# Patient Record
Sex: Female | Born: 1980 | Race: Black or African American | Hispanic: No | Marital: Single | Smoking: Current every day smoker
Health system: Southern US, Community
[De-identification: ages and names within clinical notes are randomized; demographics above are authoritative.]

## PROBLEM LIST (undated history)

## (undated) DIAGNOSIS — R519 Headache, unspecified: Secondary | ICD-10-CM

## (undated) DIAGNOSIS — K649 Unspecified hemorrhoids: Secondary | ICD-10-CM

## (undated) DIAGNOSIS — D649 Anemia, unspecified: Secondary | ICD-10-CM

## (undated) DIAGNOSIS — T148XXA Other injury of unspecified body region, initial encounter: Secondary | ICD-10-CM

## (undated) DIAGNOSIS — I10 Essential (primary) hypertension: Secondary | ICD-10-CM

## (undated) HISTORY — DX: Other injury of unspecified body region, initial encounter: T14.8XXA

---

## 2004-07-11 ENCOUNTER — Emergency Department (HOSPITAL_COMMUNITY): Admission: EM | Admit: 2004-07-11 | Discharge: 2004-07-12 | Payer: Self-pay | Admitting: Emergency Medicine

## 2005-05-27 ENCOUNTER — Emergency Department (HOSPITAL_COMMUNITY): Admission: EM | Admit: 2005-05-27 | Discharge: 2005-05-27 | Payer: Self-pay | Admitting: *Deleted

## 2005-08-25 ENCOUNTER — Emergency Department (HOSPITAL_COMMUNITY): Admission: EM | Admit: 2005-08-25 | Discharge: 2005-08-25 | Payer: Self-pay | Admitting: Emergency Medicine

## 2005-08-28 ENCOUNTER — Emergency Department (HOSPITAL_COMMUNITY): Admission: EM | Admit: 2005-08-28 | Discharge: 2005-08-28 | Payer: Self-pay | Admitting: Emergency Medicine

## 2006-03-21 ENCOUNTER — Emergency Department (HOSPITAL_COMMUNITY): Admission: EM | Admit: 2006-03-21 | Discharge: 2006-03-21 | Payer: Self-pay | Admitting: Emergency Medicine

## 2006-10-12 ENCOUNTER — Inpatient Hospital Stay (HOSPITAL_COMMUNITY): Admission: AD | Admit: 2006-10-12 | Discharge: 2006-10-12 | Payer: Self-pay | Admitting: Obstetrics and Gynecology

## 2006-11-12 ENCOUNTER — Inpatient Hospital Stay (HOSPITAL_COMMUNITY): Admission: RE | Admit: 2006-11-12 | Discharge: 2006-11-13 | Payer: Self-pay | Admitting: Obstetrics & Gynecology

## 2006-11-18 ENCOUNTER — Inpatient Hospital Stay (HOSPITAL_COMMUNITY): Admission: AD | Admit: 2006-11-18 | Discharge: 2006-11-21 | Payer: Self-pay | Admitting: Obstetrics & Gynecology

## 2006-12-12 ENCOUNTER — Emergency Department (HOSPITAL_COMMUNITY): Admission: EM | Admit: 2006-12-12 | Discharge: 2006-12-12 | Payer: Self-pay | Admitting: *Deleted

## 2010-12-06 NOTE — Discharge Summary (Signed)
NAME:  ROSELY, FERNANDEZ           ACCOUNT NO.:  [id number]   MEDICAL RECORD NO.:  [id number]          PATIENT TYPE:  EMS   LOCATION:  MINO                         FACILITY:  [facility]   PHYSICIAN:  Genia Del, M.D.DATE OF BIRTH:  1980/07/23   DATE OF ADMISSION:  12/12/2006  DATE OF DISCHARGE:  12/12/2006                               DISCHARGE SUMMARY   DISCHARGE SUMMARY:  The patient is a 30 year old G1 at 40 weeks and 5  days gestation. An attempt to induce was done for post dates with  Cervidil and Pitocin.  The patient's cervix was 1 cm, tight, long,  vertex -3, membranes were intact and she was contracting every 3-5  minutes mildly with Pitocin.  The fetal heart rate monitoring was  reactive, baseline of 140s, no decelerations. The decision was taken to  stop the induction process of giving the well-being of the baby and to  follow-up with an ultrasound for biophysical profile, estimated fetal  weight, and amniotic fluid index on April 28 and reevaluate the cervix  and decide at that time if induction is warranted.      Genia Del, M.D.  Electronically Signed     ML/MEDQ  D:  01/12/2007  T:  01/12/2007  Job:  [id number]

## 2011-01-10 ENCOUNTER — Emergency Department (HOSPITAL_BASED_OUTPATIENT_CLINIC_OR_DEPARTMENT_OTHER)
Admission: EM | Admit: 2011-01-10 | Discharge: 2011-01-10 | Disposition: A | Discharge status: Home / Self Care | Payer: Medicaid Other | Attending: Emergency Medicine | Admitting: Emergency Medicine

## 2011-01-10 DIAGNOSIS — F17200 Nicotine dependence, unspecified, uncomplicated: Secondary | ICD-10-CM | POA: Insufficient documentation

## 2011-01-10 DIAGNOSIS — R1013 Epigastric pain: Secondary | ICD-10-CM | POA: Insufficient documentation

## 2011-01-10 LAB — CBC
HCT: 35 % — ABNORMAL LOW (ref 36.0–46.0)
Hemoglobin: 12.1 g/dL (ref 12.0–15.0)
MCV: 87.3 fL (ref 78.0–100.0)
WBC: 4.8 10*3/uL (ref 4.0–10.5)

## 2011-01-10 LAB — COMPREHENSIVE METABOLIC PANEL
ALT: 15 U/L (ref 0–35)
Albumin: 3.5 g/dL (ref 3.5–5.2)
Alkaline Phosphatase: 59 U/L (ref 39–117)
BUN: 7 mg/dL (ref 6–23)
CO2: 26 mEq/L (ref 19–32)
Chloride: 103 mEq/L (ref 96–112)
GFR calc Af Amer: >60 mL/min (ref >60)
GFR calc non Af Amer: >60 mL/min (ref >60)

## 2011-01-10 LAB — URINALYSIS, ROUTINE W REFLEX MICROSCOPIC
Nitrite: NEGATIVE
Protein, ur: NEGATIVE mg/dL
pH: 6 (ref 5.0–8.0)

## 2011-01-10 LAB — DIFFERENTIAL
Basophils Absolute: 0 10*3/uL (ref 0.0–0.1)
Basophils Relative: 0 % (ref 0–1)
Lymphs Abs: 1.8 10*3/uL (ref 0.7–4.0)
Monocytes Absolute: 0.5 10*3/uL (ref 0.1–1.0)

## 2017-09-22 ENCOUNTER — Emergency Department (HOSPITAL_BASED_OUTPATIENT_CLINIC_OR_DEPARTMENT_OTHER)
Admission: EM | Admit: 2017-09-22 | Discharge: 2017-09-22 | Disposition: A | Discharge status: Home / Self Care | Payer: Self-pay | Attending: Emergency Medicine | Admitting: Emergency Medicine

## 2017-09-22 ENCOUNTER — Encounter (HOSPITAL_BASED_OUTPATIENT_CLINIC_OR_DEPARTMENT_OTHER): Payer: Self-pay

## 2017-09-22 ENCOUNTER — Other Ambulatory Visit: Payer: Self-pay

## 2017-09-22 DIAGNOSIS — F17210 Nicotine dependence, cigarettes, uncomplicated: Secondary | ICD-10-CM | POA: Insufficient documentation

## 2017-09-22 DIAGNOSIS — K645 Perianal venous thrombosis: Secondary | ICD-10-CM | POA: Insufficient documentation

## 2017-09-22 HISTORY — DX: Unspecified hemorrhoids: K64.9

## 2017-09-22 MED ORDER — OXYCODONE-ACETAMINOPHEN 5-325 MG PO TABS: 1.0000 | ORAL_TABLET | Freq: Once | ORAL | Status: DC

## 2017-09-22 MED ORDER — HYDROCORTISONE ACETATE 25 MG RE SUPP: 25.0000 mg | Freq: Two times a day (BID) | RECTAL | 0 refills | Status: DC

## 2017-09-22 NOTE — ED Notes (Signed)
Pt c/o increased rectal pain as of this afternoon, tried sitz bath without relief.  Pt has had diarrhea recently, unsure if she is having any bleeding.

## 2017-09-22 NOTE — ED Notes (Signed)
ED Provider at bedside. 

## 2017-09-22 NOTE — ED Notes (Signed)
Pt rang out asking for pain medication, nurse asked pt if her ride was here, pt yelled and cursed at nurse loud enough to be heard in other rooms with the door closed.  Pt yelled that she did not have a way to call her ride, despite using her phone and talking on it in front of nurse.  Attempted to explain to patient again that her ride needs to be at the bedside, instead, pt continued to yell and curse at nurse.

## 2017-09-22 NOTE — ED Notes (Signed)
EDP informed pt that she needed to have a ride at the bedside in order to get percocet.

## 2017-09-22 NOTE — ED Notes (Signed)
At this time, pt is discharged as she is irate, out of control and aggressive.  Went into patient's room with another staff member, and pt yelled "can't you see that I'm on phone?  Can't you see that I am busy and on the phone?"  Informed pt that she is discharged and tried to explain her prescriptions, pt continued to yell at staff.  Security and charge nurse called to room d/t pt's increased aggression.

## 2017-09-22 NOTE — ED Triage Notes (Signed)
Pt c/o hemorrhoids-hx of same with worse x today-pacing in triage

## 2017-09-22 NOTE — Discharge Instructions (Signed)
Please use steroid suppository to help with your hemorrhoid pain.  Continue warm sitz baths.  Schedule an appointment with [facility] for further evaluation and management of your ongoing hemorrhoid problems.

## 2017-09-22 NOTE — ED Notes (Signed)
Assisted EDPA with rectal exam.Patient tolerated well. Both rails put up on bed due to patient rolling all over the bed to keep patient from falling out. Stretcher locked in lowest position.

## 2017-09-22 NOTE — ED Notes (Signed)
Pt rang out bc she is in pain, informed that she is waiting for the provider

## 2017-09-22 NOTE — ED Provider Notes (Signed)
[facility] EMERGENCY DEPARTMENT Provider Note   CSN: [id number] Arrival date & time: 09/22/17  1935     History   Chief Complaint Chief Complaint  Patient presents with  . Hemorrhoids    HPI Vickie Ford is a 37 y.o. female.  HPI  Vickie Ford is a 37yo female history of hemorrhoids who presents to the emergency department for evaluation of hemorrhoid pain.  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  Her current flareup of pain began 3 days ago.  She was treating herself with over-the-counter steroid cream and sitz bath with some improvement.  Today she states that her pain has been constant and 10/10 in severity, described as "knives stabbing my butt hole." Pain worsened with sitting or having a bowel movement. Reports having soft bowel movement today, denies constipation. Denies blood in stool.  Was given information in the past to see a general surgeon, although has never followed up.  Denies fever, chills, nausea/vomiting, abdominal pain, dysuria, urinary frequency.  Past Medical History:  Diagnosis Date  . Hemorrhoid     There are no active problems to display for this patient.   Past Surgical History:  Procedure Laterality Date  . CESAREAN SECTION      OB History    No data available       Home Medications    Prior to Admission medications   Not on File    Family History No family history on file.  Social History Social History   Tobacco Use  . Smoking status: Current Every Day Smoker  . Smokeless tobacco: Never Used  Substance Use Topics  . Alcohol use: Yes    Comment: occ  . Drug use: No     Allergies   Penicillins   Review of Systems Review of Systems  Constitutional: Negative for chills and fever.  Gastrointestinal: Positive for rectal pain (hemorrhoid). Negative for abdominal pain, anal bleeding, blood in stool, nausea and  vomiting.  Musculoskeletal: Negative for gait problem.  Skin: Negative for color change and rash.     Physical Exam Updated Vital Signs BP (!) 150/71 (BP Location: Right Arm)   Pulse 94   Temp 97.9 F (36.6 C) (Oral)   Resp 14   Ht 5\' 8"  (1.727 m)   Wt (!) 162.4 kg (358 lb)   LMP 08/28/2017   SpO2 100%   BMI 54.43 kg/m   Physical Exam  Constitutional: She is oriented to person, place, and time. She appears well-developed and well-nourished. No distress.  Patient appears extremely anxious and painful, pacing.   HENT:  Head: Normocephalic and atraumatic.  Eyes: Right eye exhibits no discharge. Left eye exhibits no discharge.  Pulmonary/Chest: Effort normal. No respiratory distress.  Abdominal: Soft. Bowel sounds are normal. There is no tenderness. There is no guarding.  Genitourinary:  Genitourinary Comments: Chaperone present for exam. Several skin tags. One .5cm palpable thrombosed external hemorrhoid. No gross blood. Normal rectal tone, no tenderness, no mass or fissure.  Neurological: She is alert and oriented to person, place, and time. Coordination normal.  Skin: Skin is warm and dry. She is not diaphoretic.  Psychiatric: She has a normal mood and affect. Her behavior is normal.  Nursing note and vitals reviewed.    ED Treatments / Results  Labs (all labs ordered are listed, but only abnormal results are displayed) Labs Reviewed - No data to display  EKG  EKG Interpretation None       Radiology No results found.  Procedures Procedures (including critical care time)  Medications Ordered in ED Medications  oxyCODONE-acetaminophen (PERCOCET/ROXICET) 5-325 MG per tablet 1 tablet (1 tablet Oral Not Given 09/22/17 2206)     Initial Impression / Assessment and Plan / ED Course  I have reviewed the triage vital signs and the nursing notes.  Pertinent labs & imaging results that were available during my care of the patient were reviewed by me and considered in  my medical decision making (see chart for details).     Patient with one small thrombosed hemorrhoid. Does not appear appropriate for I&D. Have counseled patient on symptomatic management including sitz baths and will prescribe steroid suppository for pain.  No constipation or abdominal tenderness. Have given patient information to follow-up with general surgery for permanent management given this has been an ongoing problem for her over the past 5878yrs. Discussed return precautions and patient agrees and voices understanding to the above plan.   Final Clinical Impressions(s) / ED Diagnoses   Final diagnoses:  External thrombosed hemorrhoids    ED Discharge Orders        Ordered    hydrocortisone (ANUSOL-HC) 25 MG suppository  2 times daily     09/22/17 2137       Kellie ShropshireShrosbree, Zeniya Lapidus J, PA-C 09/23/17 0024    Arby BarrettePfeiffer, Marcy, MD 09/26/17 [phone]

## 2017-09-29 ENCOUNTER — Encounter (HOSPITAL_COMMUNITY): Payer: Self-pay

## 2017-09-29 ENCOUNTER — Emergency Department (HOSPITAL_COMMUNITY)
Admission: EM | Admit: 2017-09-29 | Discharge: 2017-09-29 | Discharge status: Against Medical Advice | Payer: Self-pay | Attending: Emergency Medicine | Admitting: Emergency Medicine

## 2017-09-29 DIAGNOSIS — Z5321 Procedure and treatment not carried out due to patient leaving prior to being seen by health care provider: Secondary | ICD-10-CM | POA: Insufficient documentation

## 2017-09-29 NOTE — ED Notes (Signed)
Called PT to be seen no response

## 2017-09-29 NOTE — ED Triage Notes (Signed)
Pt presents with c/o hemorrhoids. Pt was recently seen for same and reports that they are not getting any better.

## 2017-09-29 NOTE — ED Notes (Signed)
Called twice for vitals and no answer.

## 2017-10-30 ENCOUNTER — Other Ambulatory Visit: Payer: Self-pay

## 2017-10-30 ENCOUNTER — Encounter (HOSPITAL_COMMUNITY): Payer: Self-pay | Admitting: Emergency Medicine

## 2017-10-30 ENCOUNTER — Emergency Department (HOSPITAL_COMMUNITY)
Admission: EM | Admit: 2017-10-30 | Discharge: 2017-10-31 | Disposition: A | Discharge status: Home / Self Care | Payer: Self-pay | Attending: Emergency Medicine | Admitting: Emergency Medicine

## 2017-10-30 DIAGNOSIS — Z5321 Procedure and treatment not carried out due to patient leaving prior to being seen by health care provider: Secondary | ICD-10-CM | POA: Insufficient documentation

## 2017-10-30 DIAGNOSIS — K649 Unspecified hemorrhoids: Secondary | ICD-10-CM | POA: Insufficient documentation

## 2017-10-30 NOTE — ED Triage Notes (Signed)
C/o hemorrhoids x 2 months.  States she has been seen in ED x 4 for same.

## 2017-10-30 NOTE — ED Notes (Signed)
No answer for triage.

## 2017-10-31 NOTE — ED Notes (Signed)
No answer in waiting area.

## 2017-10-31 NOTE — ED Notes (Addendum)
No answer in waiting area.  Previous nurse first/tech first did not see patient leave.

## 2017-10-31 NOTE — ED Notes (Signed)
No answer for treatment room. 

## 2018-03-07 ENCOUNTER — Encounter (HOSPITAL_COMMUNITY): Payer: Self-pay

## 2018-03-07 ENCOUNTER — Other Ambulatory Visit: Payer: Self-pay

## 2018-03-07 ENCOUNTER — Emergency Department (HOSPITAL_COMMUNITY)
Admission: EM | Admit: 2018-03-07 | Discharge: 2018-03-07 | Disposition: A | Discharge status: Home / Self Care | Payer: Self-pay | Attending: Emergency Medicine | Admitting: Emergency Medicine

## 2018-03-07 DIAGNOSIS — K649 Unspecified hemorrhoids: Secondary | ICD-10-CM | POA: Insufficient documentation

## 2018-03-07 DIAGNOSIS — F17200 Nicotine dependence, unspecified, uncomplicated: Secondary | ICD-10-CM | POA: Insufficient documentation

## 2018-03-07 DIAGNOSIS — Z79899 Other long term (current) drug therapy: Secondary | ICD-10-CM | POA: Insufficient documentation

## 2018-03-07 MED ORDER — DOCUSATE SODIUM 100 MG PO CAPS: 100.0000 mg | ORAL_CAPSULE | Freq: Two times a day (BID) | ORAL | 0 refills | Status: DC

## 2018-03-07 MED ORDER — LIDOCAINE 5 % EX OINT: 1.0000 "application " | TOPICAL_OINTMENT | Freq: Four times a day (QID) | CUTANEOUS | 0 refills | Status: DC | PRN

## 2018-03-07 MED ORDER — PRAMOXINE HCL 1 % RE FOAM: 1.0000 "application " | Freq: Three times a day (TID) | RECTAL | 0 refills | Status: DC | PRN

## 2018-03-07 NOTE — ED Triage Notes (Signed)
She c/o persistent pain and swelling of hemorrhoids x 5 month. She is here today with c/o: "I want a doctor to cut it so it will get better" [sic].

## 2018-03-07 NOTE — ED Provider Notes (Signed)
[facility]-EMERGENCY DEPT Provider Note   CSN: [id number] Arrival date & time: 03/07/18  1142     History   Chief Complaint Chief Complaint  Patient presents with  . Hemorrhoids    HPI Vickie Ford is a 37 y.o. female.  HPI Patient presented to the emergency room for evaluation of persistent rectal pain for the last 5 months.  Patient states she has been seen at various emergency rooms.  She has been told she has hemorrhoids.  Patient has not followed up with anyone else because of insurance issues.  Patient states that she gets pain in the rectal area whenever she has a bowel wound.  She also feel a ache that feels like a charley horse.  Patient occasionally has noticed blood in her stool.  She does feel like she has a mass that bulges out when she has a bowel movement.  Patient was told before that she could have them cut out in the emergency room and want to see if that is a possibility.  Patient denies any fevers.  No weight loss.  She has normal caliber stools. Past Medical History:  Diagnosis Date  . Hemorrhoid     There are no active problems to display for this patient.   Past Surgical History:  Procedure Laterality Date  . CESAREAN SECTION       OB History   None      Home Medications    Prior to Admission medications   Medication Sig Start Date End Date Taking? Authorizing Provider  docusate sodium (COLACE) 100 MG capsule Take 1 capsule (100 mg total) by mouth every 12 (twelve) hours. 03/07/18   Linwood DibblesKnapp, Skie Vitrano, MD  hydrocortisone (ANUSOL-HC) 25 MG suppository Place 1 suppository (25 mg total) rectally 2 (two) times daily. 09/22/17   Kellie ShropshireShrosbree, Emily J, PA-C  lidocaine (XYLOCAINE) 5 % ointment Apply 1 application topically 4 (four) times daily as needed. 03/07/18   Linwood DibblesKnapp, Jansel Vonstein, MD  pramoxine (PROCTOFOAM) 1 % foam Place 1 application rectally 3 (three) times daily as needed for anal itching. 03/07/18   Linwood DibblesKnapp, Duell Holdren, MD    Family History No  family history on file.  Social History Social History   Tobacco Use  . Smoking status: Current Every Day Smoker  . Smokeless tobacco: Never Used  Substance Use Topics  . Alcohol use: Yes    Comment: occ  . Drug use: No     Allergies   Penicillins   Review of Systems Review of Systems  All other systems reviewed and are negative.    Physical Exam Updated Vital Signs LMP 02/21/2018 (Approximate)   Physical Exam  Constitutional: She appears well-developed and well-nourished. No distress.  Overweight  HENT:  Head: Normocephalic and atraumatic.  Right Ear: External ear normal.  Left Ear: External ear normal.  Eyes: Conjunctivae are normal. Right eye exhibits no discharge. Left eye exhibits no discharge. No scleral icterus.  Neck: Neck supple. No tracheal deviation present.  Cardiovascular: Normal rate.  Pulmonary/Chest: Effort normal. No stridor. No respiratory distress.  Abdominal: She exhibits no distension.  Genitourinary:  Genitourinary Comments: Patient has few small skin tags noted on the rectal exam, no obvious thrombosed external hemorrhoid, on internal digital rectal exam there is pain and discomfort in am unable to completely evaluate  Musculoskeletal: She exhibits no edema.  Neurological: She is alert. Cranial nerve deficit: no gross deficits.  Skin: Skin is warm and dry. No rash noted.  Psychiatric: She has a normal  mood and affect.  Nursing note and vitals reviewed.    ED Treatments / Results  Labs (all labs ordered are listed, but only abnormal results are displayed) Labs Reviewed - No data to display  EKG None  Radiology No results found.  Procedures Procedures (including critical care time)  Medications Ordered in ED Medications - No data to display   Initial Impression / Assessment and Plan / ED Course  I have reviewed the triage vital signs and the nursing notes.  Pertinent labs & imaging results that were available during my care  of the patient were reviewed by me and considered in my medical decision making (see chart for details).   Possible the patient may be having internal hemorrhoids.  She could also have something like an anal fissure.  Patient does not have any trouble with fevers or weight loss to suggest some type of systemic issue.  I do not feel any mass to suggest anal cancer.  I think the patient would benefit from evaluation by general surgery or gastroneurology for flexible sigmoidoscopy.  Discussed the importance of her trying to do this.  I also suggested possibly seeing a [facility] intermittently might be able to assist her with specialty follow-up.  Final Clinical Impressions(s) / ED Diagnoses   Final diagnoses:  Hemorrhoids, unspecified hemorrhoid type    ED Discharge Orders         Ordered    lidocaine (XYLOCAINE) 5 % ointment  4 times daily PRN     03/07/18 1321    pramoxine (PROCTOFOAM) 1 % foam  3 times daily PRN     03/07/18 1321    docusate sodium (COLACE) 100 MG capsule  Every 12 hours     03/07/18 1321           Linwood DibblesKnapp, Vernessa Likes, MD 03/07/18 1325

## 2018-03-07 NOTE — Discharge Instructions (Signed)
Follow-up with a general surgeon or gastroenterologist for further evaluation of your persistent rectal pain, consider following up with a primary care doctor

## 2018-12-09 DIAGNOSIS — Z131 Encounter for screening for diabetes mellitus: Secondary | ICD-10-CM | POA: Diagnosis not present

## 2018-12-09 DIAGNOSIS — Z01118 Encounter for examination of ears and hearing with other abnormal findings: Secondary | ICD-10-CM | POA: Diagnosis not present

## 2018-12-09 DIAGNOSIS — Z136 Encounter for screening for cardiovascular disorders: Secondary | ICD-10-CM | POA: Diagnosis not present

## 2018-12-09 DIAGNOSIS — Z720 Tobacco use: Secondary | ICD-10-CM | POA: Diagnosis not present

## 2018-12-09 DIAGNOSIS — Z1389 Encounter for screening for other disorder: Secondary | ICD-10-CM | POA: Diagnosis not present

## 2018-12-09 DIAGNOSIS — Z1329 Encounter for screening for other suspected endocrine disorder: Secondary | ICD-10-CM | POA: Diagnosis not present

## 2018-12-09 DIAGNOSIS — Z5181 Encounter for therapeutic drug level monitoring: Secondary | ICD-10-CM | POA: Diagnosis not present

## 2018-12-09 DIAGNOSIS — I10 Essential (primary) hypertension: Secondary | ICD-10-CM | POA: Diagnosis not present

## 2018-12-09 DIAGNOSIS — E782 Mixed hyperlipidemia: Secondary | ICD-10-CM | POA: Diagnosis not present

## 2018-12-09 DIAGNOSIS — Z01021 Encounter for examination of eyes and vision following failed vision screening with abnormal findings: Secondary | ICD-10-CM | POA: Diagnosis not present

## 2018-12-31 DIAGNOSIS — R0600 Dyspnea, unspecified: Secondary | ICD-10-CM | POA: Diagnosis not present

## 2018-12-31 DIAGNOSIS — I10 Essential (primary) hypertension: Secondary | ICD-10-CM | POA: Diagnosis not present

## 2019-01-17 DIAGNOSIS — K641 Second degree hemorrhoids: Secondary | ICD-10-CM | POA: Diagnosis not present

## 2019-01-17 DIAGNOSIS — E782 Mixed hyperlipidemia: Secondary | ICD-10-CM | POA: Diagnosis not present

## 2019-01-17 DIAGNOSIS — Z720 Tobacco use: Secondary | ICD-10-CM | POA: Diagnosis not present

## 2019-01-17 DIAGNOSIS — Z01118 Encounter for examination of ears and hearing with other abnormal findings: Secondary | ICD-10-CM | POA: Diagnosis not present

## 2019-01-17 DIAGNOSIS — I10 Essential (primary) hypertension: Secondary | ICD-10-CM | POA: Diagnosis not present

## 2019-02-02 DIAGNOSIS — K625 Hemorrhage of anus and rectum: Secondary | ICD-10-CM | POA: Diagnosis not present

## 2019-02-02 DIAGNOSIS — K649 Unspecified hemorrhoids: Secondary | ICD-10-CM | POA: Diagnosis not present

## 2019-02-02 DIAGNOSIS — K5900 Constipation, unspecified: Secondary | ICD-10-CM | POA: Diagnosis not present

## 2019-02-02 DIAGNOSIS — K6289 Other specified diseases of anus and rectum: Secondary | ICD-10-CM | POA: Diagnosis not present

## 2019-02-21 ENCOUNTER — Other Ambulatory Visit: Payer: Self-pay | Admitting: Gastroenterology

## 2019-02-21 DIAGNOSIS — K625 Hemorrhage of anus and rectum: Secondary | ICD-10-CM | POA: Diagnosis not present

## 2019-02-21 DIAGNOSIS — K5900 Constipation, unspecified: Secondary | ICD-10-CM | POA: Diagnosis not present

## 2019-02-21 DIAGNOSIS — K649 Unspecified hemorrhoids: Secondary | ICD-10-CM | POA: Diagnosis not present

## 2019-03-19 ENCOUNTER — Other Ambulatory Visit (HOSPITAL_COMMUNITY): Admission: RE | Admit: 2019-03-19 | Payer: Medicaid Other | Source: Ambulatory Visit

## 2019-03-21 ENCOUNTER — Other Ambulatory Visit: Payer: Self-pay

## 2019-03-21 ENCOUNTER — Encounter (HOSPITAL_COMMUNITY): Payer: Self-pay | Admitting: *Deleted

## 2019-03-21 ENCOUNTER — Other Ambulatory Visit (HOSPITAL_COMMUNITY)
Admission: RE | Admit: 2019-03-21 | Discharge: 2019-03-21 | Disposition: A | Discharge status: Home / Self Care | Payer: Medicaid Other | Source: Ambulatory Visit | Attending: Gastroenterology | Admitting: Gastroenterology

## 2019-03-21 DIAGNOSIS — Z01812 Encounter for preprocedural laboratory examination: Secondary | ICD-10-CM | POA: Insufficient documentation

## 2019-03-21 DIAGNOSIS — Z20828 Contact with and (suspected) exposure to other viral communicable diseases: Secondary | ICD-10-CM | POA: Diagnosis not present

## 2019-03-21 NOTE — Progress Notes (Signed)
Pt to contact Dr. Erlinda Hong office regarding bowel prep prior to procedure.

## 2019-03-21 NOTE — Progress Notes (Signed)
Contacted patient about needing covid testing before surgery on 9/2, left message for patient to return call

## 2019-03-22 ENCOUNTER — Other Ambulatory Visit: Payer: Self-pay | Admitting: Gastroenterology

## 2019-03-22 LAB — SARS CORONAVIRUS 2 (TAT 6-24 HRS): SARS Coronavirus 2: NEGATIVE

## 2019-03-22 NOTE — Anesthesia Preprocedure Evaluation (Addendum)
Anesthesia Evaluation  Patient identified by MRN, date of birth, ID band Patient awake    Reviewed: Allergy & Precautions, NPO status , Patient's Chart, lab work & pertinent test results  History of Anesthesia Complications Negative for: history of anesthetic complications  Airway Mallampati: II  TM Distance: >3 FB Neck ROM: Full    Dental no notable dental hx.    Pulmonary neg pulmonary ROS, Current Smoker,    Pulmonary exam normal        Cardiovascular hypertension, Pt. on medications Normal cardiovascular exam     Neuro/Psych negative neurological ROS     GI/Hepatic negative GI ROS, Neg liver ROS,   Endo/Other  Morbid obesity  Renal/GU negative Renal ROS     Musculoskeletal negative musculoskeletal ROS (+)   Abdominal   Peds  Hematology negative hematology ROS (+)   Anesthesia Other Findings Day of surgery medications reviewed with the patient.  Reproductive/Obstetrics                            Anesthesia Physical Anesthesia Plan  ASA: III  Anesthesia Plan: MAC   Post-op Pain Management:    Induction:   PONV Risk Score and Plan: 1 and Treatment may vary due to age or medical condition and Propofol infusion  Airway Management Planned: Natural Airway and Simple Face Mask  Additional Equipment: None  Intra-op Plan:   Post-operative Plan:   Informed Consent: I have reviewed the patients History and Physical, chart, labs and discussed the procedure including the risks, benefits and alternatives for the proposed anesthesia with the patient or authorized representative who has indicated his/her understanding and acceptance.     Dental advisory given  Plan Discussed with: CRNA  Anesthesia Plan Comments:        Anesthesia Quick Evaluation

## 2019-03-23 ENCOUNTER — Ambulatory Visit (HOSPITAL_COMMUNITY): Payer: Medicaid Other | Admitting: Anesthesiology

## 2019-03-23 ENCOUNTER — Encounter (HOSPITAL_COMMUNITY)
Admission: RE | Disposition: A | Discharge status: Home / Self Care | Payer: Self-pay | Source: Home / Self Care | Attending: Gastroenterology

## 2019-03-23 ENCOUNTER — Encounter (HOSPITAL_COMMUNITY): Payer: Self-pay | Admitting: *Deleted

## 2019-03-23 ENCOUNTER — Ambulatory Visit (HOSPITAL_COMMUNITY)
Admission: RE | Admit: 2019-03-23 | Discharge: 2019-03-23 | Disposition: A | Discharge status: Home / Self Care | Payer: Medicaid Other | Attending: Gastroenterology | Admitting: Gastroenterology

## 2019-03-23 DIAGNOSIS — F17210 Nicotine dependence, cigarettes, uncomplicated: Secondary | ICD-10-CM | POA: Diagnosis not present

## 2019-03-23 DIAGNOSIS — K921 Melena: Secondary | ICD-10-CM | POA: Diagnosis not present

## 2019-03-23 DIAGNOSIS — Z6841 Body Mass Index (BMI) 40.0 and over, adult: Secondary | ICD-10-CM | POA: Diagnosis not present

## 2019-03-23 DIAGNOSIS — K649 Unspecified hemorrhoids: Secondary | ICD-10-CM | POA: Diagnosis not present

## 2019-03-23 DIAGNOSIS — I10 Essential (primary) hypertension: Secondary | ICD-10-CM | POA: Insufficient documentation

## 2019-03-23 DIAGNOSIS — K5900 Constipation, unspecified: Secondary | ICD-10-CM | POA: Diagnosis not present

## 2019-03-23 DIAGNOSIS — Z79899 Other long term (current) drug therapy: Secondary | ICD-10-CM | POA: Insufficient documentation

## 2019-03-23 DIAGNOSIS — K625 Hemorrhage of anus and rectum: Secondary | ICD-10-CM | POA: Diagnosis not present

## 2019-03-23 HISTORY — DX: Essential (primary) hypertension: I10

## 2019-03-23 HISTORY — PX: COLONOSCOPY WITH PROPOFOL: SHX5780

## 2019-03-23 LAB — PREGNANCY, URINE: Preg Test, Ur: NEGATIVE

## 2019-03-23 SURGERY — COLONOSCOPY WITH PROPOFOL
Anesthesia: Monitor Anesthesia Care

## 2019-03-23 MED ORDER — PROPOFOL 10 MG/ML IV BOLUS
INTRAVENOUS | Status: AC
  Filled 2019-03-23: qty 20

## 2019-03-23 MED ORDER — PROMETHAZINE HCL 25 MG/ML IJ SOLN: 6.2500 mg | INTRAMUSCULAR | Status: DC | PRN

## 2019-03-23 MED ORDER — SODIUM CHLORIDE 0.9 % IV SOLN: INTRAVENOUS | Status: DC

## 2019-03-23 MED ORDER — LIDOCAINE HCL 1 % IJ SOLN
INTRAMUSCULAR | Status: DC | PRN
  Administered 2019-03-23: 60 mg via INTRADERMAL
  Administered 2019-03-23: 40 mg via INTRADERMAL

## 2019-03-23 MED ORDER — PROPOFOL 500 MG/50ML IV EMUL
INTRAVENOUS | Status: DC | PRN
  Administered 2019-03-23: 150 ug/kg/min via INTRAVENOUS

## 2019-03-23 MED ORDER — LACTATED RINGERS IV SOLN
INTRAVENOUS | Status: DC
  Administered 2019-03-23: 08:00:00 via INTRAVENOUS

## 2019-03-23 MED ORDER — PROPOFOL 10 MG/ML IV BOLUS
INTRAVENOUS | Status: AC
  Filled 2019-03-23: qty 40

## 2019-03-23 SURGICAL SUPPLY — 22 items

## 2019-03-23 NOTE — Op Note (Signed)
[facility] Patient Name: Vickie Ford Procedure Date: 03/23/2019 MRN: [id number] Attending MD: Arta Silence , MD Date of Birth: 1981-05-13 CSN: [id number] Age: 38 Admit Type: Outpatient Procedure:                Colonoscopy Indications:              Hematochezia Providers:                Arta Silence, MD, Cleda Daub, RN, Elspeth Cho Tech., Technician, Karis Juba, CRNA Referring MD:              Medicines:                Propofol per Anesthesia Complications:            No immediate complications. Estimated Blood Loss:     Estimated blood loss: none. Procedure:                Pre-Anesthesia Assessment:                           - Prior to the procedure, a History and Physical                            was performed, and patient medications and                            allergies were reviewed. The patient's tolerance of                            previous anesthesia was also reviewed. The risks                            and benefits of the procedure and the sedation                            options and risks were discussed with the patient.                            All questions were answered, and informed consent                            was obtained. Prior Anticoagulants: The patient has                            taken no previous anticoagulant or antiplatelet                            agents. ASA Grade Assessment: III - A patient with                            severe systemic disease. After reviewing the risks  and benefits, the patient was deemed in                            satisfactory condition to undergo the procedure.                           After obtaining informed consent, the colonoscope                            was passed under direct vision. Throughout the                            procedure, the patient's blood pressure, pulse, and   oxygen saturations were monitored continuously. The                            [id number] ([id number] Olympus colonoscope was                            introduced through the anus and advanced to the the                            cecum, identified by appendiceal orifice and                            ileocecal valve. The ileocecal valve, appendiceal                            orifice, and rectum were photographed. The entire                            colon was examined. The colonoscopy was performed                            without difficulty. The patient tolerated the                            procedure well. The quality of the bowel                            preparation was good. Scope In: 8:51:09 AM Scope Out: 9:12:49 AM Scope Withdrawal Time: 0 hours 10 minutes 52 seconds  Total Procedure Duration: 0 hours 21 minutes 40 seconds  Findings:      Hemorrhoids were found on perianal exam.      The colon (entire examined portion) appeared normal.      The retroflexed view of the distal rectum and anal verge was normal and       showed no anal or rectal abnormalities. Impression:               - Hemorrhoids found on perianal exam. Medium sized.                            No evidence of thrombosis.                           -  The entire examined colon is normal.                           - The distal rectum and anal verge are normal on                            retroflexion view.                           - The examination was otherwise normal. Moderate Sedation:      None Recommendation:           - Patient has a contact number available for                            emergencies. The signs and symptoms of potential                            delayed complications were discussed with the                            patient. Return to normal activities tomorrow.                            Written discharge instructions were provided to the                            patient.                            - Discharge patient to home (via wheelchair).                           - High fiber diet indefinitely.                           - Topical therapies (e.g., Preparation-H),                            avoidance of constipation/straining, liberal water                            intake, for management of hemorrhoids. If medical                            therapy fails, could consider surgical referral but                            it would be last-resort option.                           - Return to GI clinic in 6 weeks.                           - Repeat colonoscopy in 10 years for screening  purposes.                           - Return to referring physician as previously                            scheduled. Procedure Code(s):        --- Professional ---                           [phone], Colonoscopy, flexible; diagnostic, including                            collection of specimen(s) by brushing or washing,                            when performed (separate procedure) Diagnosis Code(s):        --- Professional ---                           K64.9, Unspecified hemorrhoids                           K92.1, Melena (includes Hematochezia) CPT copyright 2019 American Medical Association. All rights reserved. The codes documented in this report are preliminary and upon coder review may  be revised to meet current compliance requirements. Willis Modena, MD 03/23/2019 9:28:50 AM This report has been signed electronically. Number of Addenda: 0

## 2019-03-23 NOTE — Transfer of Care (Signed)
Immediate Anesthesia Transfer of Care Note  Patient: Vickie Ford  Procedure(s) Performed: COLONOSCOPY WITH PROPOFOL (N/A )  Patient Location: PACU and Endoscopy Unit  Anesthesia Type:MAC  Level of Consciousness: awake, alert , oriented and patient cooperative  Airway & Oxygen Therapy: Patient Spontanous Breathing and Patient connected to face mask oxygen  Post-op Assessment: Report given to RN and Post -op Vital signs reviewed and stable  Post vital signs: Reviewed and stable  Last Vitals:  Vitals Value Taken Time  BP    Temp    Pulse    Resp    SpO2      Last Pain:  Vitals:   03/23/19 0806  TempSrc: Oral  PainSc: 0-No pain         Complications: No apparent anesthesia complications

## 2019-03-23 NOTE — Anesthesia Postprocedure Evaluation (Signed)
Anesthesia Post Note  Patient: Vickie Ford  Procedure(s) Performed: COLONOSCOPY WITH PROPOFOL (N/A )     Patient location during evaluation: PACU Anesthesia Type: MAC Level of consciousness: awake and alert and oriented Pain management: pain level controlled Vital Signs Assessment: post-procedure vital signs reviewed and stable Respiratory status: spontaneous breathing, nonlabored ventilation and respiratory function stable Cardiovascular status: blood pressure returned to baseline Postop Assessment: no apparent nausea or vomiting Anesthetic complications: no    Last Vitals:  Vitals:   03/23/19 0930 03/23/19 0940  BP: (!) 171/87 (!) 145/95  Pulse: 67 64  Resp: 14 (!) 21  Temp:    SpO2: 100% 100%    Last Pain:  Vitals:   03/23/19 0940  TempSrc:   PainSc: 0-No pain                 Brennan Bailey

## 2019-03-23 NOTE — Discharge Instructions (Signed)
YOU HAD AN ENDOSCOPIC PROCEDURE TODAY: Refer to the procedure report and other information in the discharge instructions given to you for any specific questions about what was found during the examination. If this information does not answer your questions, please call [facility] office at [phone] to clarify.  ? ?YOU SHOULD EXPECT: Some feelings of bloating in the abdomen. Passage of more gas than usual. Walking can help get rid of the air that was put into your GI tract during the procedure and reduce the bloating. If you had a lower endoscopy (such as a colonoscopy or flexible sigmoidoscopy) you may notice spotting of blood in your stool or on the toilet paper. Some abdominal soreness may be present for a day or two, also. ? ?DIET: Your first meal following the procedure should be a light meal and then it is ok to progress to your normal diet. A half-sandwich or bowl of soup is an example of a good first meal. Heavy or fried foods are harder to digest and may make you feel nauseous or bloated. Drink plenty of fluids but you should avoid alcoholic beverages for 24 hours.  ? ?ACTIVITY: Your care partner should take you home directly after the procedure. You should plan to take it easy, moving slowly for the rest of the day. You can resume normal activity the day after the procedure however YOU SHOULD NOT DRIVE, use power tools, machinery or perform tasks that involve climbing or major physical exertion for 24 hours (because of the sedation medicines used during the test).  ? ?SYMPTOMS TO REPORT IMMEDIATELY: ?A gastroenterologist can be reached at any hour. Please call [phone]  for any of the following symptoms:  ?Following lower endoscopy (colonoscopy, flexible sigmoidoscopy) ?Excessive amounts of blood in the stool  ?Significant tenderness, worsening of abdominal pains  ?Swelling of the abdomen that is new, acute  ?Fever of 100? or higher  ? ?FOLLOW UP:  ?If any biopsies were taken you will be contacted by  phone or by letter within the next 1-3 weeks. Call [phone]  if you have not heard about the biopsies in 3 weeks.  ?Please also call with any specific questions about appointments or follow up tests.  ?

## 2019-03-23 NOTE — H&P (Signed)
Patient interval history reviewed.  Patient examined again.  There has been no change from documented H/P scanned into chart from our office except as documented below.  Assessment:  1.  Hematochezia. 2.  Hemorrhoids.  Plan:  1.  Colonoscopy. 2.  Risks (bleeding, infection, bowel perforation that could require surgery, sedation-related changes in cardiopulmonary systems), benefits (identification and possible treatment of source of symptoms, exclusion of certain causes of symptoms), and alternatives (watchful waiting, radiographic imaging studies, empiric medical treatment) of colonoscopy were explained to patient/family in detail and patient wishes to proceed.

## 2019-03-24 ENCOUNTER — Encounter (HOSPITAL_COMMUNITY): Payer: Self-pay | Admitting: Gastroenterology

## 2019-06-13 DIAGNOSIS — Z01118 Encounter for examination of ears and hearing with other abnormal findings: Secondary | ICD-10-CM | POA: Diagnosis not present

## 2019-06-13 DIAGNOSIS — N760 Acute vaginitis: Secondary | ICD-10-CM | POA: Diagnosis not present

## 2019-06-13 DIAGNOSIS — K641 Second degree hemorrhoids: Secondary | ICD-10-CM | POA: Diagnosis not present

## 2019-06-13 DIAGNOSIS — Z720 Tobacco use: Secondary | ICD-10-CM | POA: Diagnosis not present

## 2019-06-13 DIAGNOSIS — E782 Mixed hyperlipidemia: Secondary | ICD-10-CM | POA: Diagnosis not present

## 2019-06-13 DIAGNOSIS — I10 Essential (primary) hypertension: Secondary | ICD-10-CM | POA: Diagnosis not present

## 2019-06-16 ENCOUNTER — Encounter (HOSPITAL_COMMUNITY): Payer: Self-pay | Admitting: Emergency Medicine

## 2019-06-16 ENCOUNTER — Emergency Department (HOSPITAL_COMMUNITY)
Admission: EM | Admit: 2019-06-16 | Discharge: 2019-06-16 | Disposition: A | Discharge status: Home / Self Care | Payer: Medicaid Other | Attending: Emergency Medicine | Admitting: Emergency Medicine

## 2019-06-16 ENCOUNTER — Other Ambulatory Visit: Payer: Self-pay

## 2019-06-16 DIAGNOSIS — F17210 Nicotine dependence, cigarettes, uncomplicated: Secondary | ICD-10-CM | POA: Insufficient documentation

## 2019-06-16 DIAGNOSIS — I10 Essential (primary) hypertension: Secondary | ICD-10-CM | POA: Insufficient documentation

## 2019-06-16 DIAGNOSIS — J3489 Other specified disorders of nose and nasal sinuses: Secondary | ICD-10-CM | POA: Diagnosis not present

## 2019-06-16 DIAGNOSIS — Z79899 Other long term (current) drug therapy: Secondary | ICD-10-CM | POA: Diagnosis not present

## 2019-06-16 DIAGNOSIS — M7918 Myalgia, other site: Secondary | ICD-10-CM | POA: Diagnosis not present

## 2019-06-16 DIAGNOSIS — R0981 Nasal congestion: Secondary | ICD-10-CM | POA: Insufficient documentation

## 2019-06-16 DIAGNOSIS — M79605 Pain in left leg: Secondary | ICD-10-CM | POA: Insufficient documentation

## 2019-06-16 DIAGNOSIS — M79604 Pain in right leg: Secondary | ICD-10-CM | POA: Insufficient documentation

## 2019-06-16 LAB — URINALYSIS, ROUTINE W REFLEX MICROSCOPIC
Bilirubin Urine: NEGATIVE
Glucose, UA: NEGATIVE mg/dL
Hgb urine dipstick: NEGATIVE
Ketones, ur: 20 mg/dL — AB
Nitrite: NEGATIVE
Protein, ur: NEGATIVE mg/dL
Specific Gravity, Urine: 1.02 (ref 1.005–1.030)
pH: 5 (ref 5.0–8.0)

## 2019-06-16 LAB — BASIC METABOLIC PANEL
Anion gap: 10 (ref 5–15)
BUN: 9 mg/dL (ref 6–20)
CO2: 21 mmol/L — ABNORMAL LOW (ref 22–32)
Calcium: 8.6 mg/dL — ABNORMAL LOW (ref 8.9–10.3)
Chloride: 103 mmol/L (ref 98–111)
Creatinine, Ser: 0.64 mg/dL (ref 0.44–1.00)
GFR calc Af Amer: >60 mL/min (ref >60)
GFR calc non Af Amer: >60 mL/min (ref >60)
Glucose, Bld: 96 mg/dL (ref 70–99)
Potassium: 3.9 mmol/L (ref 3.5–5.1)
Sodium: 134 mmol/L — ABNORMAL LOW (ref 135–145)

## 2019-06-16 MED ORDER — IBUPROFEN 200 MG PO TABS
600.0000 mg | ORAL_TABLET | Freq: Once | ORAL | Status: AC
  Administered 2019-06-16: 02:00:00 600 mg via ORAL
  Filled 2019-06-16: qty 3

## 2019-06-16 MED ORDER — IBUPROFEN 600 MG PO TABS: 600.0000 mg | ORAL_TABLET | Freq: Four times a day (QID) | ORAL | 0 refills | Status: DC | PRN

## 2019-06-16 NOTE — ED Provider Notes (Signed)
[facility]-EMERGENCY DEPT Provider Note   CSN: [id number] Arrival date & time: 06/16/19  0010     History   Chief Complaint Chief Complaint  Patient presents with  . Leg Pain    HPI Vickie Ford is a 38 y.o. female.     Patient to ED with complaint of aching in bilateral calves x 1 week. No swelling, discoloration, injury or history of similar symptoms. No chest pain or SOB. She states she was seen at an Urgent Care yesterday and told she had protein in her urine and "something about my potassium". She also c/o nasal congestion without fever, cough, SOB, or sore throat. She states that a COVID and flu test at the Urgent Care visit yesterday was negative. She had been out of her blood pressure medications for one month prior to restarting yesterday.   The history is provided by the patient. No language interpreter was used.  Leg Pain Associated symptoms: no fever     Past Medical History:  Diagnosis Date  . Hemorrhoid   . Hypertension     There are no active problems to display for this patient.   Past Surgical History:  Procedure Laterality Date  . CESAREAN SECTION    . COLONOSCOPY WITH PROPOFOL N/A 03/23/2019   Procedure: COLONOSCOPY WITH PROPOFOL;  Surgeon: Willis Modenautlaw, William, MD;  Location: [facility] END[facility];  Service: Endoscopy;  Laterality: N/A;     OB History   No obstetric history on file.      Home Medications    Prior to Admission medications   Medication Sig Start Date End Date Taking? Authorizing Provider  acetaminophen (TYLENOL) 500 MG tablet Take 1,000 mg by mouth every 6 (six) hours as needed for headache (cramps).   Yes [provider]  amLODipine (NORVASC) 5 MG tablet Take 5 mg by mouth at bedtime.   Yes [provider]  metroNIDAZOLE (FLAGYL) 500 MG tablet Take 500 mg by mouth 3 (three) times daily. 06/13/19  Yes [provider]  pseudoephedrine (SUDAFED) 30 MG tablet Take 30 mg by mouth every 4  (four) hours as needed for congestion.   Yes [provider]    Family History History reviewed. No pertinent family history.  Social History Social History   Tobacco Use  . Smoking status: Current Every Day Smoker    Packs/day: 0.25    Years: 9.00    Pack years: 2.25    Types: Cigarettes  . Smokeless tobacco: Never Used  . Tobacco comment: 4-5 cigarettes per day  Substance Use Topics  . Alcohol use: Yes    Comment: occ  . Drug use: Not Currently    Types: Marijuana     Allergies   Penicillin g and Penicillins   Review of Systems Review of Systems  Constitutional: Negative for chills and fever.  HENT: Positive for congestion and rhinorrhea.   Respiratory: Negative.  Negative for cough and shortness of breath.   Cardiovascular: Negative.  Negative for chest pain.  Gastrointestinal: Negative.  Negative for abdominal pain, diarrhea, nausea and vomiting.  Endocrine: Negative for polydipsia.  Genitourinary:       Reports nocturia, "every 1-2 hours"  Musculoskeletal:       See HPI.  Skin: Negative.  Negative for color change.  Neurological: Negative.  Negative for weakness and headaches.     Physical Exam Updated Vital Signs BP (!) 137/98 (BP Location: Right Arm)   Pulse 86   Temp 98.4 F (36.9 C) (Oral)  Resp 16   Ht 5\' 7"  (1.702 m)   Wt (!) 158.8 kg   SpO2 98%   BMI 54.82 kg/m   Physical Exam Vitals signs and nursing note reviewed.  Constitutional:      Appearance: She is well-developed.  HENT:     Head: Normocephalic.  Neck:     Musculoskeletal: Normal range of motion and neck supple.  Cardiovascular:     Rate and Rhythm: Normal rate and regular rhythm.  Pulmonary:     Effort: Pulmonary effort is normal.     Breath sounds: Normal breath sounds.  Abdominal:     General: Bowel sounds are normal.     Palpations: Abdomen is soft.     Tenderness: There is no abdominal tenderness. There is no guarding or rebound.  Musculoskeletal: Normal  range of motion.     Comments: LE's are unremarkable in appearance. No redness or swelling. Calves as mildly tender bilaterally without induration or firmness. Distal pulses intact.   Skin:    General: Skin is warm and dry.     Findings: No rash.  Neurological:     Mental Status: She is alert and oriented to person, place, and time.     Sensory: No sensory deficit.      ED Treatments / Results  Labs (all labs ordered are listed, but only abnormal results are displayed) Labs Reviewed  URINALYSIS, [place]   Results for orders placed or performed during the hospital encounter of 06/16/19  Urinalysis, Routine w reflex microscopic  Result Value Ref Range   Color, Urine YELLOW YELLOW   APPearance CLOUDY (A) CLEAR   Specific Gravity, Urine 1.020 1.005 - 1.030   pH 5.0 5.0 - 8.0   Glucose, UA NEGATIVE NEGATIVE mg/dL   Hgb urine dipstick NEGATIVE NEGATIVE   Bilirubin Urine NEGATIVE NEGATIVE   Ketones, ur 20 (A) NEGATIVE mg/dL   Protein, ur NEGATIVE NEGATIVE mg/dL   Nitrite NEGATIVE NEGATIVE   Leukocytes,Ua SMALL (A) NEGATIVE   RBC / HPF 0-5 0 - 5 RBC/hpf   WBC, UA 6-10 0 - 5 WBC/hpf   Bacteria, UA FEW (A) NONE SEEN   Squamous Epithelial / LPF 21-50 0 - 5   Mucus PRESENT   Basic metabolic panel  Result Value Ref Range   Sodium 134 (L) 135 - 145 mmol/L   Potassium 3.9 3.5 - 5.1 mmol/L   Chloride 103 98 - 111 mmol/L   CO2 21 (L) 22 - 32 mmol/L   Glucose, Bld 96 70 - 99 mg/dL   BUN 9 6 - 20 mg/dL   Creatinine, Ser 0.64 0.44 - 1.00 mg/dL   Calcium 8.6 (L) 8.9 - 10.3 mg/dL   GFR calc non Af Amer >60 >60 mL/min   GFR calc Af Amer >60 >60 mL/min   Anion gap 10 5 - 15    EKG None  Radiology No results found.  Procedures Procedures (including critical care time)  Medications Ordered in ED Medications  ibuprofen (ADVIL) tablet 600 mg (has no administration in time range)     Initial Impression / Assessment and Plan / ED  Course  I have reviewed the triage vital signs and the nursing notes.  Pertinent labs & imaging results that were available during my care of the patient were reviewed by me and considered in my medical decision making (see chart for details).        Patient to ED with c/o bilateral LE muscle  aching x 1 week.   The patient is well appearing and in NAD. Calves are benign on exam without swelling, redness, induration. Good distal pulses. Doubt infection, DVT or vascular compromise, or injury.   Ibuprofen with some relief. No significant lab abnormalities. Patient reassured. Recommend continued use of ibuprofen and recheck with PCP in one week.   Final Clinical Impressions(s) / ED Diagnoses   Final diagnoses:  None   1. Lower extremity pain  ED Discharge Orders    None       Elpidio Anis, PA-C 06/16/19 2505    Sabas Sous, MD 06/16/19 [phone]

## 2019-06-16 NOTE — Discharge Instructions (Addendum)
Follow up with your doctor in one week for recheck. Take ibuprofen as directed. Continue all your regular medications.

## 2019-06-16 NOTE — ED Triage Notes (Signed)
Patient states that she saw an urgent care doctor and they her that she has protein in her urine. Patient is complaining of leg pain. Patient states she feels that she is dehydrated.

## 2019-06-29 ENCOUNTER — Other Ambulatory Visit: Payer: Self-pay

## 2019-06-29 ENCOUNTER — Encounter (HOSPITAL_COMMUNITY): Payer: Self-pay | Admitting: Student

## 2019-06-29 ENCOUNTER — Inpatient Hospital Stay (HOSPITAL_COMMUNITY)
Admission: EM | Admit: 2019-06-29 | Discharge: 2019-06-29 | Disposition: A | Discharge status: Home / Self Care | Payer: Medicaid Other | Attending: Obstetrics and Gynecology | Admitting: Obstetrics and Gynecology

## 2019-06-29 DIAGNOSIS — F17210 Nicotine dependence, cigarettes, uncomplicated: Secondary | ICD-10-CM | POA: Diagnosis not present

## 2019-06-29 DIAGNOSIS — O99331 Smoking (tobacco) complicating pregnancy, first trimester: Secondary | ICD-10-CM | POA: Diagnosis not present

## 2019-06-29 DIAGNOSIS — Z3201 Encounter for pregnancy test, result positive: Secondary | ICD-10-CM | POA: Insufficient documentation

## 2019-06-29 DIAGNOSIS — O10919 Unspecified pre-existing hypertension complicating pregnancy, unspecified trimester: Secondary | ICD-10-CM

## 2019-06-29 DIAGNOSIS — Z79899 Other long term (current) drug therapy: Secondary | ICD-10-CM | POA: Diagnosis not present

## 2019-06-29 DIAGNOSIS — O99891 Other specified diseases and conditions complicating pregnancy: Secondary | ICD-10-CM | POA: Insufficient documentation

## 2019-06-29 DIAGNOSIS — O26891 Other specified pregnancy related conditions, first trimester: Secondary | ICD-10-CM | POA: Diagnosis not present

## 2019-06-29 DIAGNOSIS — Z792 Long term (current) use of antibiotics: Secondary | ICD-10-CM | POA: Diagnosis not present

## 2019-06-29 DIAGNOSIS — Z880 Allergy status to penicillin: Secondary | ICD-10-CM | POA: Insufficient documentation

## 2019-06-29 DIAGNOSIS — Z3A01 Less than 8 weeks gestation of pregnancy: Secondary | ICD-10-CM | POA: Diagnosis not present

## 2019-06-29 DIAGNOSIS — O34219 Maternal care for unspecified type scar from previous cesarean delivery: Secondary | ICD-10-CM | POA: Diagnosis not present

## 2019-06-29 DIAGNOSIS — R519 Headache, unspecified: Secondary | ICD-10-CM | POA: Diagnosis not present

## 2019-06-29 DIAGNOSIS — O161 Unspecified maternal hypertension, first trimester: Secondary | ICD-10-CM | POA: Insufficient documentation

## 2019-06-29 LAB — POCT PREGNANCY, URINE: Preg Test, Ur: POSITIVE — AB

## 2019-06-29 MED ORDER — PREPLUS 27-1 MG PO TABS: 1.0000 | ORAL_TABLET | Freq: Every day | ORAL | 11 refills | Status: DC

## 2019-06-29 MED ORDER — FERROUS SULFATE 325 (65 FE) MG PO TABS: 325.0000 mg | ORAL_TABLET | Freq: Every day | ORAL | 1 refills | Status: DC

## 2019-06-29 MED ORDER — BUTALBITAL-APAP-CAFFEINE 50-325-40 MG PO TABS
2.0000 | ORAL_TABLET | Freq: Once | ORAL | Status: AC
  Administered 2019-06-29: 2 via ORAL
  Filled 2019-06-29: qty 2

## 2019-06-29 MED ORDER — LABETALOL HCL 200 MG PO TABS: 200.0000 mg | ORAL_TABLET | Freq: Two times a day (BID) | ORAL | 1 refills | Status: AC

## 2019-06-29 NOTE — MAU Note (Addendum)
Vickie Ford is a 38 y.o. at [redacted]w[redacted]d here in MAU reporting: + UPT at home yesterday. Been having a headache and is cold but when she is sleeping she sweats. Denies abdominal pain, vaginal bleeding, and discharge.  LMP: 05/05/19 approximately, states it was about 2 weeks before halloween  Onset of complaint: ongoing  Pain score: 8/10  Vitals:   06/29/19 1026 06/29/19 1121  BP: (!) 108/93 (!) 158/81  Pulse: 99 100  Resp: 17 18  Temp: 99.9 F (37.7 C)   SpO2: 98% 100%     Lab orders placed from triage: UA

## 2019-06-29 NOTE — MAU Provider Note (Signed)
Chief Complaint: Headache   First Provider Initiated Contact with Patient 06/29/19 1149     SUBJECTIVE HPI: Vickie Ford is a 38 y.o. [id number] at [redacted]w[redacted]d by LMP who presents to [facility] reporting a headache. Symptoms started 3 days ago. Reports constant frontal headache. Took 1 dose of ibuprofen yesterday which helped with her symptoms but stopped taking it when she found out she was pregnant. Has had some nausea but no vomiting. Denies abdominal pain or vaginal bleeding. No fever/chills or respiratory complaints.   Location: head Quality: pressure Severity: 8/10 on pain scale Duration: 3 days Timing: constant Modifying factors: improved with ibuprofen. Nothing makes worse.  Associated signs and symptoms: nausea  Past Medical History:  Diagnosis Date  . Hemorrhoid   . Hypertension    OB History  Gravida Para Term Preterm AB Living  4 2 2   1 2   SAB TAB Ectopic Multiple Live Births    1     2    # Outcome Date GA Lbr Len/2nd Weight Sex Delivery Anes PTL Lv  4 Current           3 Term 09/14/14 [redacted]w[redacted]d    CS-LTranv   LIV     Complications: Fetal distress affecting labor, Oligohydramnios  2 Term 11/19/06 [redacted]w[redacted]d   M Vag-Spont   LIV  1 TAB            Past Surgical History:  Procedure Laterality Date  . CESAREAN SECTION    . COLONOSCOPY WITH PROPOFOL N/A 03/23/2019   Procedure: COLONOSCOPY WITH PROPOFOL;  Surgeon: 05/23/2019, MD;  Location: [facility];  Service: Endoscopy;  Laterality: N/A;   Social History   Socioeconomic History  . Marital status: Single    Spouse name: Not on file  . Number of children: Not on file  . Years of education: Not on file  . Highest education level: Not on file  Occupational History  . Not on file  Social Needs  . Financial resource strain: Not on file  . Food insecurity    Worry: Not on file    Inability: Not on file  . Transportation needs    Medical: Not on file    Non-medical: Not on file  Tobacco Use  . Smoking  status: Current Every Day Smoker    Packs/day: 0.25    Years: 9.00    Pack years: 2.25    Types: Cigarettes  . Smokeless tobacco: Never Used  . Tobacco comment: 4-5 cigarettes per day  Substance and Sexual Activity  . Alcohol use: Yes    Comment: occ  . Drug use: Not Currently    Types: Marijuana  . Sexual activity: Not on file  Lifestyle  . Physical activity    Days per week: Not on file    Minutes per session: Not on file  . Stress: Not on file  Relationships  . Social Willis Modena on phone: Not on file    Gets together: Not on file    Attends religious service: Not on file    Active member of club or organization: Not on file    Attends meetings of clubs or organizations: Not on file    Relationship status: Not on file  . Intimate partner violence    Fear of current or ex partner: Not on file    Emotionally abused: Not on file    Physically abused: Not on file    Forced sexual activity: Not on file  Other Topics Concern  . Not on file  Social History Narrative  . Not on file   History reviewed. No pertinent family history. No current facility-administered medications on file prior to encounter.    Current Outpatient Medications on File Prior to Encounter  Medication Sig Dispense Refill  . acetaminophen (TYLENOL) 500 MG tablet Take 1,000 mg by mouth every 6 (six) hours as needed for headache (cramps).    Marland Kitchen. amLODipine (NORVASC) 5 MG tablet Take 5 mg by mouth at bedtime.    Marland Kitchen. ibuprofen (ADVIL) 600 MG tablet Take 1 tablet (600 mg total) by mouth every 6 (six) hours as needed. 30 tablet 0  . pseudoephedrine (SUDAFED) 30 MG tablet Take 30 mg by mouth every 4 (four) hours as needed for congestion.    . metroNIDAZOLE (FLAGYL) 500 MG tablet Take 500 mg by mouth 3 (three) times daily.     Allergies  Allergen Reactions  . Penicillin G Anaphylaxis    Unknown  . Penicillins     Hands and feet peeled Did it involve swelling of the face/tongue/throat, SOB, or low BP?  No Did it involve sudden or severe rash/hives, skin peeling, or any reaction on the inside of your mouth or nose? Yes Did you need to seek medical attention at a hospital or doctor's office? Unknown When did it last happen?Childhood allergy If all above answers are "NO", may proceed with cephalosporin use.     I have reviewed patient's Past Medical Hx, Surgical Hx, Family Hx, Social Hx, medications and allergies.   Review of Systems  Constitutional: Negative.   Eyes: Negative for photophobia.  Gastrointestinal: Positive for nausea. Negative for abdominal pain, constipation, diarrhea and vomiting.  Genitourinary: Negative.   Neurological: Positive for headaches.    OBJECTIVE Patient Vitals for the past 24 hrs:  BP Temp Temp src Pulse Resp SpO2 Height Weight  06/29/19 1315 (!) 133/51 99 F (37.2 C) Oral 90 18 97 % - -  06/29/19 1135 (!) 111/56 99.7 F (37.6 C) Oral 89 - - - -  06/29/19 1121 (!) 158/81 - Oral 100 18 100 % - -  06/29/19 1117 - - - - - - 5\' 7"  (1.702 m) (!) 158.8 kg  06/29/19 1026 (!) 108/93 99.9 F (37.7 C) Oral 99 17 98 % - -   Constitutional: Well-developed, well-nourished female in no acute distress.  Cardiovascular: normal rate & rhythm, no murmur Respiratory: normal rate and effort. Lung sounds clear throughout GI: Abd soft, non-tender, Pos BS x 4. No guarding or rebound tenderness MS: Extremities nontender, no edema, normal ROM Neurologic: Alert and oriented x 4.      LAB RESULTS Results for orders placed or performed during the hospital encounter of 06/29/19 (from the past 24 hour(s))  Pregnancy, urine POC     Status: Abnormal   Collection Time: 06/29/19 10:46 AM  Result Value Ref Range   Preg Test, Ur POSITIVE (A) NEGATIVE    IMAGING No results found.  MAU COURSE Orders Placed This Encounter  Procedures  . US OB LESS THAN 14 WEEKS W/ OB TRANSVAGINAL AND DOPPLER  . Urinalysis, Routine w reflex microscopic  . POC Urine Pregnancy, ED (not  [facility])  . Pregnancy, urine POC  . Discharge patient   Meds ordered this encounter  Medications  . butalbital-acetaminophen-caffeine (FIORICET) 50-325-40 MG per tablet 2 tablet  . labetalol (NORMODYNE) 200 MG tablet    Sig: Take 1 tablet (200 mg total) by mouth 2 (two) times daily.  Dispense:  60 tablet    Refill:  1    Order Specific Question:   Supervising Provider    Answer:   CONSTANT, PEGGY [[id number]]  . Prenatal Vit-Fe Fumarate-FA (PREPLUS) 27-1 MG TABS    Sig: Take 1 tablet by mouth daily.    Dispense:  30 tablet    Refill:  11    Order Specific Question:   Supervising Provider    Answer:   CONSTANT, PEGGY [[id number]]  . ferrous sulfate (FERROUSUL) 325 (65 FE) MG tablet    Sig: Take 1 tablet (325 mg total) by mouth daily with breakfast.    Dispense:  30 tablet    Refill:  1    Order Specific Question:   Supervising Provider    Answer:   CONSTANT, [place]    MDM UPT positive. Denies abdominal pain or vaginal bleeding. Will provide with list of ob/gyn providers & pregnancy verification letter.   Headache treated with fioricet. Pt reports improvement in symptoms  Pt has chronic hypertension. Previously on norvasc but stopped recently. Will start on labetalol 200 bid.   Pt reports history of anemia & thinks she's anemic again because she is pregnant. Currently not treating it. Declines blood testing today but would like to start on iron supplementation.   ASSESSMENT 1. Headache in pregnancy, antepartum, first trimester   2. Positive pregnancy test   3. Chronic hypertension during pregnancy     PLAN Discharge home in stable condition. Discussed reasons to return to MAU vs ED Rx PNV, ferrous sulfate, & labetalol Outpatient dating ultrasound ordered Pt needs to start prenatal care due to high risk pregnancy  Follow-up Information    [facility].   Specialty: Radiology Why: You will be called to schedule an  ultrasound Contact information: [address] [place], [place] [id number] [place] [id number] [phone]         Allergies as of 06/29/2019      Reactions   Penicillin G Anaphylaxis   Unknown   Penicillins    Hands and feet peeled Did it involve swelling of the face/tongue/throat, SOB, or low BP? No Did it involve sudden or severe rash/hives, skin peeling, or any reaction on the inside of your mouth or nose? Yes Did you need to seek medical attention at a hospital or doctor's office? Unknown When did it last happen?Childhood allergy If all above answers are "NO", may proceed with cephalosporin use.      Medication List    STOP taking these medications   amLODipine 5 MG tablet Commonly known as: NORVASC   ibuprofen 600 MG tablet Commonly known as: ADVIL   metroNIDAZOLE 500 MG tablet Commonly known as: FLAGYL   pseudoephedrine 30 MG tablet Commonly known as: SUDAFED     TAKE these medications   acetaminophen 500 MG tablet Commonly known as: TYLENOL Take 1,000 mg by mouth every 6 (six) hours as needed for headache (cramps).   ferrous sulfate 325 (65 FE) MG tablet Commonly known as: FerrouSul Take 1 tablet (325 mg total) by mouth daily with breakfast.   labetalol 200 MG tablet Commonly known as: NORMODYNE Take 1 tablet (200 mg total) by mouth 2 (two) times daily.   PrePLUS 27-1 MG Tabs Take 1 tablet by mouth daily.        Jorje Guild, NP 06/29/2019  1:44 PM

## 2019-06-29 NOTE — MAU Note (Signed)
Contacted [facility] main lab and [facility] ED mini lab regarding pt urine sample. States sample was not sent to main lab and was likely discarded after completing UPT. Will have to collected another sample in MAU.

## 2019-06-29 NOTE — Discharge Instructions (Signed)
[facility] Providers   Center for [facility] at [facility]       Phone: [phone]  Center for [facility] [place] Phone: [phone]  Center for [facility] at [place]  Phone: [phone]  Center for [facility] at [facility]  Phone: [phone]  Center for [facility] at [place]  Phone: [phone]  [facility]       Phone: [phone]  [facility]    Phone: [phone]   [facility] [place])    Phone: [phone]  [facility]    Phone: [phone]  [facility]    Phone: [phone]   [facility]  Phone: [phone]  [facility]    Phone: [phone]  [facility]   Phone: [phone]  [facility]    Phone: [phone]        Hypertension During Pregnancy Hypertension is also called high blood pressure. High blood pressure means that the force of your blood moving in your body is too strong. It can cause problems for you and your baby. Different types of high blood pressure can happen during pregnancy. The types are:  High blood pressure before you got pregnant. This is called chronic hypertension.  This can continue during your pregnancy. Your doctor will want to keep checking your blood pressure. You may need medicine to keep your blood pressure under control while you are pregnant. You will need follow-up visits after you have your baby.  High blood pressure that goes up during pregnancy when it was normal before. This is called gestational hypertension. It will usually get better after you have your baby, but your doctor will need to watch your blood pressure to make sure that it is getting better.  Very high blood pressure during pregnancy. This is called preeclampsia. Very high blood  pressure is an emergency that needs to be checked and treated right away.  You may develop very high blood pressure after giving birth. This is called postpartum preeclampsia. This usually occurs within 48 hours after childbirth but may occur up to 6 weeks after giving birth. This is rare. How does this affect me? If you have high blood pressure during pregnancy, you have a higher chance of developing high blood pressure:  As you get older.  If you get pregnant again. In some cases, high blood pressure during pregnancy can cause:  Stroke.  Heart attack.  Damage to the kidneys, lungs, or liver.  Preeclampsia.  Jerky movements you cannot control (convulsions or seizures).  Problems with the placenta. How does this affect my baby? Your baby may:  Be born early.  Not weigh as much as he or she should.  Not handle labor well, leading to a c-section birth. What are the risks?  Having high blood pressure during a past pregnancy.  Being overweight.  Being 77 years old or older.  Being pregnant for the first time.  Being pregnant with more than one baby.  Becoming pregnant using fertility methods, such as IVF.  Having other problems, such as diabetes, or kidney disease.  Having family members who have high blood pressure. What can I do to lower my risk?   Keep a healthy weight.  Eat a healthy diet.  Follow what your doctor tells you about treating any medical problems that you had before becoming pregnant. It is very important to go to all of your doctor visits.  Your doctor will check your blood pressure and make sure that your pregnancy is progressing as it should. Treatment should start early if a problem is found. How is this treated? Treatment for high blood pressure during pregnancy can differ depending on the type of high blood pressure you have and how serious it is.  You may need to take blood pressure medicine.  If you have been taking medicine for your  blood pressure, you may need to change the medicine during pregnancy if it is not safe for your baby.  If your doctor thinks that you could get very high blood pressure, he or she may tell you to take a low-dose aspirin during your pregnancy.  If you have very high blood pressure, you may need to stay in the hospital so you and your baby can be watched closely. You may also need to take medicine to lower your blood pressure. This medicine may be given by mouth or through an IV tube.  In some cases, if your condition gets worse, you may need to have your baby early. Follow these instructions at home: Eating and drinking   Drink enough fluid to keep your pee (urine) pale yellow.  Avoid caffeine. Lifestyle  Do not use any products that contain nicotine or tobacco, such as cigarettes, e-cigarettes, and chewing tobacco. If you need help quitting, ask your doctor.  Do not use alcohol or drugs.  Avoid stress.  Rest and get plenty of sleep.  Regular exercise can help. Ask your doctor what kinds of exercise are best for you. General instructions  Take over-the-counter and prescription medicines only as told by your doctor.  Keep all prenatal and follow-up visits as told by your doctor. This is important. Contact a doctor if:  You have symptoms that your doctor told you to watch for, such as: ? Headaches. ? Nausea. ? Vomiting. ? Belly (abdominal) pain. ? Dizziness. ? Light-headedness. Get help right away if:  You have: ? Very bad belly pain that does not get better with treatment. ? A very bad headache that does not get better. ? Vomiting that does not get better. ? Sudden, fast weight gain. ? Sudden swelling in your hands, ankles, or face. ? Bleeding from your vagina. ? Blood in your pee. ? Blurry vision. ? Double vision. ? Shortness of breath. ? Chest pain. ? Weakness on one side of your body. ? Trouble talking.  Your baby is not moving as much as  usual. Summary  High blood pressure is also called hypertension.  High blood pressure means that the force of your blood moving in your body is too strong.  High blood pressure can cause problems for you and your baby.  Keep all follow-up visits as told by your doctor. This is important. This information is not intended to replace advice given to you by your health care provider. Make sure you discuss any questions you have with your health care provider. Document Released: 08/09/2010 Document Revised: 10/28/2018 Document Reviewed: 08/03/2018 Elsevier Patient Education  2020 [place] Headache Without Cause A headache is pain or discomfort felt around the head or neck area. The specific cause of a headache may not be found. There are many causes and types of headaches. A few common ones are:  Tension headaches.  Migraine headaches.  Cluster headaches.  Chronic daily headaches. Follow these instructions at home: Watch your condition for any changes. Let your health care provider know about them. Take  these steps to help with your condition: Managing pain      Take over-the-counter and prescription medicines only as told by your health care provider.  Lie down in a dark, quiet room when you have a headache.  If directed, put ice on your head and neck area: ? Put ice in a plastic bag. ? Place a towel between your skin and the bag. ? Leave the ice on for 20 minutes, 2-3 times per day.  If directed, apply heat to the affected area. Use the heat source that your health care provider recommends, such as a moist heat pack or a heating pad. ? Place a towel between your skin and the heat source. ? Leave the heat on for 20-30 minutes. ? Remove the heat if your skin turns bright red. This is especially important if you are unable to feel pain, heat, or cold. You may have a greater risk of getting burned.  Keep lights dim if bright lights bother you or make your  headaches worse. Eating and drinking  Eat meals on a regular schedule.  If you drink alcohol: ? Limit how much you use to:  0-1 drink a day for women.  0-2 drinks a day for men. ? Be aware of how much alcohol is in your drink. In the [place] one drink equals one 12 oz bottle of beer (355 mL), one 5 oz glass of wine (148 mL), or one 1 oz glass of hard liquor (44 mL).  Stop drinking caffeine, or decrease the amount of caffeine you drink. General instructions   Keep a headache journal to help find out what may trigger your headaches. For example, write down: ? What you eat and drink. ? How much sleep you get. ? Any change to your diet or medicines.  Try massage or other relaxation techniques.  Limit stress.  Sit up straight, and do not tense your muscles.  Do not use any products that contain nicotine or tobacco, such as cigarettes, e-cigarettes, and chewing tobacco. If you need help quitting, ask your health care provider.  Exercise regularly as told by your health care provider.  Sleep on a regular schedule. Get 7-9 hours of sleep each night, or the amount recommended by your health care provider.  Keep all follow-up visits as told by your health care provider. This is important. Contact a health care provider if:  Your symptoms are not helped by medicine.  You have a headache that is different from the usual headache.  You have nausea or you vomit.  You have a fever. Get help right away if:  Your headache becomes severe quickly.  Your headache gets worse after moderate to intense physical activity.  You have repeated vomiting.  You have a stiff neck.  You have a loss of vision.  You have problems with speech.  You have pain in the eye or ear.  You have muscular weakness or loss of muscle control.  You lose your balance or have trouble walking.  You feel faint or pass out.  You have confusion.  You have a seizure. Summary  A headache is pain or  discomfort felt around the head or neck area.  There are many causes and types of headaches. In some cases, the cause may not be found.  Keep a headache journal to help find out what may trigger your headaches. Watch your condition for any changes. Let your health care provider know about them.  Contact a health care provider if  you have a headache that is different from the usual headache, or if your symptoms are not helped by medicine.  Get help right away if your headache becomes severe, you vomit, you have a loss of vision, you lose your balance, or you have a seizure. This information is not intended to replace advice given to you by your health care provider. Make sure you discuss any questions you have with your health care provider. Document Released: 07/07/2005 Document Revised: 01/25/2018 Document Reviewed: 01/25/2018 Elsevier Patient Education  2020 [employer]

## 2019-07-05 ENCOUNTER — Encounter (HOSPITAL_COMMUNITY): Payer: Self-pay | Admitting: Obstetrics & Gynecology

## 2019-07-05 ENCOUNTER — Other Ambulatory Visit: Payer: Self-pay

## 2019-07-05 ENCOUNTER — Inpatient Hospital Stay (HOSPITAL_COMMUNITY)
Admission: AD | Admit: 2019-07-05 | Discharge: 2019-07-05 | Disposition: A | Discharge status: Home / Self Care | Payer: Medicaid Other | Attending: Obstetrics & Gynecology | Admitting: Obstetrics & Gynecology

## 2019-07-05 DIAGNOSIS — Z20828 Contact with and (suspected) exposure to other viral communicable diseases: Secondary | ICD-10-CM | POA: Insufficient documentation

## 2019-07-05 DIAGNOSIS — G44209 Tension-type headache, unspecified, not intractable: Secondary | ICD-10-CM | POA: Diagnosis not present

## 2019-07-05 DIAGNOSIS — O26891 Other specified pregnancy related conditions, first trimester: Secondary | ICD-10-CM | POA: Insufficient documentation

## 2019-07-05 DIAGNOSIS — O99351 Diseases of the nervous system complicating pregnancy, first trimester: Secondary | ICD-10-CM | POA: Insufficient documentation

## 2019-07-05 DIAGNOSIS — O219 Vomiting of pregnancy, unspecified: Secondary | ICD-10-CM | POA: Diagnosis not present

## 2019-07-05 DIAGNOSIS — R05 Cough: Secondary | ICD-10-CM | POA: Insufficient documentation

## 2019-07-05 DIAGNOSIS — O99331 Smoking (tobacco) complicating pregnancy, first trimester: Secondary | ICD-10-CM | POA: Insufficient documentation

## 2019-07-05 DIAGNOSIS — O161 Unspecified maternal hypertension, first trimester: Secondary | ICD-10-CM | POA: Diagnosis not present

## 2019-07-05 DIAGNOSIS — R109 Unspecified abdominal pain: Secondary | ICD-10-CM | POA: Diagnosis not present

## 2019-07-05 DIAGNOSIS — F17210 Nicotine dependence, cigarettes, uncomplicated: Secondary | ICD-10-CM | POA: Insufficient documentation

## 2019-07-05 DIAGNOSIS — Z3A08 8 weeks gestation of pregnancy: Secondary | ICD-10-CM | POA: Diagnosis not present

## 2019-07-05 DIAGNOSIS — Z01812 Encounter for preprocedural laboratory examination: Secondary | ICD-10-CM | POA: Insufficient documentation

## 2019-07-05 DIAGNOSIS — Z79899 Other long term (current) drug therapy: Secondary | ICD-10-CM | POA: Insufficient documentation

## 2019-07-05 DIAGNOSIS — R059 Cough, unspecified: Secondary | ICD-10-CM

## 2019-07-05 HISTORY — DX: Anemia, unspecified: D64.9

## 2019-07-05 HISTORY — DX: Headache, unspecified: R51.9

## 2019-07-05 LAB — BASIC METABOLIC PANEL
Anion gap: 13 (ref 5–15)
BUN: 6 mg/dL (ref 6–20)
CO2: 20 mmol/L — ABNORMAL LOW (ref 22–32)
Calcium: 8.2 mg/dL — ABNORMAL LOW (ref 8.9–10.3)
Chloride: 102 mmol/L (ref 98–111)
Creatinine, Ser: 0.7 mg/dL (ref 0.44–1.00)
GFR calc Af Amer: >60 mL/min (ref >60)
GFR calc non Af Amer: >60 mL/min (ref >60)
Glucose, Bld: 86 mg/dL (ref 70–99)
Potassium: 4.3 mmol/L (ref 3.5–5.1)
Sodium: 135 mmol/L (ref 135–145)

## 2019-07-05 LAB — URINALYSIS, ROUTINE W REFLEX MICROSCOPIC
Bilirubin Urine: NEGATIVE
Glucose, UA: NEGATIVE mg/dL
Hgb urine dipstick: NEGATIVE
Ketones, ur: 20 mg/dL — AB
Leukocytes,Ua: NEGATIVE
Nitrite: NEGATIVE
Protein, ur: 100 mg/dL — AB
Specific Gravity, Urine: 1.03 (ref 1.005–1.030)
pH: 5 (ref 5.0–8.0)

## 2019-07-05 LAB — CBC
HCT: 34.6 % — ABNORMAL LOW (ref 36.0–46.0)
Hemoglobin: 11.8 g/dL — ABNORMAL LOW (ref 12.0–15.0)
MCH: 30.1 pg (ref 26.0–34.0)
MCHC: 34.1 g/dL (ref 30.0–36.0)
MCV: 88.3 fL (ref 80.0–100.0)
Platelets: 231 10*3/uL (ref 150–400)
RBC: 3.92 MIL/uL (ref 3.87–5.11)
RDW: 12.4 % (ref 11.5–15.5)
WBC: 3.5 10*3/uL — ABNORMAL LOW (ref 4.0–10.5)
nRBC: 0 % (ref 0.0–0.2)

## 2019-07-05 LAB — SARS CORONAVIRUS 2 (TAT 6-24 HRS): SARS Coronavirus 2: NEGATIVE

## 2019-07-05 MED ORDER — DIPHENHYDRAMINE HCL 50 MG/ML IJ SOLN
25.0000 mg | Freq: Once | INTRAMUSCULAR | Status: AC
  Administered 2019-07-05: 25 mg via INTRAVENOUS
  Filled 2019-07-05: qty 1

## 2019-07-05 MED ORDER — PROMETHAZINE HCL 25 MG PO TABS: 12.5000 mg | ORAL_TABLET | Freq: Four times a day (QID) | ORAL | 0 refills | Status: AC | PRN

## 2019-07-05 MED ORDER — DEXAMETHASONE SODIUM PHOSPHATE 10 MG/ML IJ SOLN
10.0000 mg | Freq: Once | INTRAMUSCULAR | Status: AC
  Administered 2019-07-05: 10 mg via INTRAVENOUS
  Filled 2019-07-05: qty 1

## 2019-07-05 MED ORDER — METOCLOPRAMIDE HCL 5 MG/ML IJ SOLN
10.0000 mg | Freq: Once | INTRAMUSCULAR | Status: AC
  Administered 2019-07-05: 10 mg via INTRAVENOUS
  Filled 2019-07-05: qty 2

## 2019-07-05 MED ORDER — LACTATED RINGERS IV BOLUS
1000.0000 mL | Freq: Once | INTRAVENOUS | Status: AC
  Administered 2019-07-05: 1000 mL via INTRAVENOUS

## 2019-07-05 NOTE — ED Notes (Signed)
Report to [facility] at [facility], waiting for transport to take [facility] to [facility]

## 2019-07-05 NOTE — Discharge Instructions (Signed)
Tension Headache, Adult A tension headache is pain, pressure, or aching in your head. Tension headaches can last from 30 minutes to several days. Follow these instructions at home: Managing pain  Take over-the-counter and prescription medicines only as told by your doctor.  When you have a headache, lie down in a dark, quiet room.  If told, put ice on your head and neck: ? Put ice in a plastic bag. ? Place a towel between your skin and the bag. ? Leave the ice on for 20 minutes, 2-3 times a day.  If told, put heat on the back of your neck. Do this as often as your doctor tells you to. Use the kind of heat that your doctor recommends, such as a moist heat pack or a heating pad. ? Place a towel between your skin and the heat. ? Leave the heat on for 20-30 minutes. ? Remove the heat if your skin turns bright red. Eating and drinking  Eat meals on a regular schedule.  Watch how much alcohol you drink: ? If you are a woman and are not pregnant, do not drink more than 1 drink a day. ? If you are a man, do not drink more than 2 drinks a day.  Drink enough fluid to keep your pee (urine) pale yellow.  Do not use a lot of caffeine, or stop using caffeine. Lifestyle  Get enough sleep. Get 7-9 hours of sleep each night. Or get the amount of sleep that your doctor tells you to.  At bedtime, remove all electronic devices from your room. Examples of electronic devices are computers, phones, and tablets.  Find ways to lessen your stress. Some things that can lessen stress are: ? Exercise. ? Deep breathing. ? Yoga. ? Music. ? Positive thoughts.  Sit up straight. Do not tighten (tense) your muscles.  Do not use any products that have nicotine or tobacco in them, such as cigarettes and e-cigarettes. If you need help quitting, ask your doctor. General instructions   Keep all follow-up visits as told by your doctor. This is important.  Avoid things that can bring on headaches. Keep a  journal to find out if certain things bring on headaches. For example, write down: ? What you eat and drink. ? How much sleep you get. ? Any change to your diet or medicines. Contact a doctor if:  Your headache does not get better.  Your headache comes back.  You have a headache and sounds, light, or smells bother you.  You feel sick to your stomach (nauseous) or you throw up (vomit).  Your stomach hurts. Get help right away if:  You suddenly get a very bad headache along with any of these: ? A stiff neck. ? Feeling sick to your stomach. ? Throwing up. ? Feeling weak. ? Trouble seeing. ? Feeling short of breath. ? A rash. ? Feeling unusually sleepy. ? Trouble speaking. ? Pain in your eye or ear. ? Trouble walking or balancing. ? Feeling like you will pass out (faint). ? Passing out. Summary  A tension headache is pain, pressure, or aching in your head.  Tension headaches can last from 30 minutes to several days.  Lifestyle changes and medicines may help relieve pain. This information is not intended to replace advice given to you by your health care provider. Make sure you discuss any questions you have with your health care provider. Document Released: 10/01/2009 Document Revised: 06/19/2017 Document Reviewed: 10/17/2016 Elsevier Patient Education  2020 Elsevier   Inc.   Morning Sickness  Morning sickness is when you feel sick to your stomach (nauseous) during pregnancy. You may feel sick to your stomach and throw up (vomit). You may feel sick in the morning, but you can feel this way at any time of day. Some women feel very sick to their stomach and cannot stop throwing up (hyperemesis gravidarum). Follow these instructions at home: Medicines  Take over-the-counter and prescription medicines only as told by your doctor. Do not take any medicines until you talk with your doctor about them first.  Taking multivitamins before getting pregnant can stop or lessen the  harshness of morning sickness. Eating and drinking  Eat dry toast or crackers before getting out of bed.  Eat 5 or 6 small meals a day.  Eat dry and bland foods like rice and baked potatoes.  Do not eat greasy, fatty, or spicy foods.  Have someone cook for you if the smell of food causes you to feel sick or throw up.  If you feel sick to your stomach after taking prenatal vitamins, take them at night or with a snack.  Eat protein when you need a snack. Nuts, yogurt, and cheese are good choices.  Drink fluids throughout the day.  Try ginger ale made with real ginger, ginger tea made from fresh grated ginger, or ginger candies. General instructions  Do not use any products that have nicotine or tobacco in them, such as cigarettes and e-cigarettes. If you need help quitting, ask your doctor.  Use an air purifier to keep the air in your house free of smells.  Get lots of fresh air.  Try to avoid smells that make you feel sick.  Try: ? Wearing a bracelet that is used for seasickness (acupressure wristband). ? Going to a doctor who puts thin needles into certain body points (acupuncture) to improve how you feel. Contact a doctor if:  You need medicine to feel better.  You feel dizzy or light-headed.  You are losing weight. Get help right away if:  You feel very sick to your stomach and cannot stop throwing up.  You pass out (faint).  You have very bad pain in your belly. Summary  Morning sickness is when you feel sick to your stomach (nauseous) during pregnancy.  You may feel sick in the morning, but you can feel this way at any time of day.  Making some changes to what you eat may help your symptoms go away. This information is not intended to replace advice given to you by your health care provider. Make sure you discuss any questions you have with your health care provider. Document Released: 08/14/2004 Document Revised: 06/19/2017 Document Reviewed:  08/07/2016 Elsevier Patient Education  2020 [employer].

## 2019-07-05 NOTE — [facility] Note (Signed)
Sent from ER, 8wks, abd pain.

## 2019-07-05 NOTE — [facility] Note (Signed)
Keep having really really bad headaches, can['t eat anything, throws up everything she tries to eat.  Was having sharp abd cramps early this morning, gone now.  Thinks she is just dehydrated really bad, thinks she needs fluids. Has lost 10 lbs since last here. Has been taking Tylenol all day and night, it helps, but the headache comes right back.

## 2019-07-05 NOTE — MAU Provider Note (Signed)
History     CSN: [id number]  Arrival date and time: 07/05/19 1235   First Provider Initiated Contact with Patient 07/05/19 1527      Chief Complaint  Patient presents with  . Headache  . Emesis   37 y.o. G1W2993 @[redacted]w[redacted]d  presenting with HA. HA started 2 weeks ago. Frequency is daily. Located in frontal region. Rates pain 8/10. Uses Tylenol which helps temporarily. Denies visual disturbances. Reports occasional vomiting, poor appetite, and weight loss. Also reports non-productive cough x2 wks. Had negative COVID test 2 weeks ago. No known positive contacts. No fevers or sore throat. Hx of CHTN, switched 1 week ago to Labetalol.    OB History    Gravida  4   Para  2   Term  2   Preterm      AB  1   Living  2     SAB      TAB  1   Ectopic      Multiple      Live Births  2           Past Medical History:  Diagnosis Date  . Anemia   . Headache   . Hemorrhoid   . Hypertension     Past Surgical History:  Procedure Laterality Date  . CESAREAN SECTION    . COLONOSCOPY WITH PROPOFOL N/A 03/23/2019   Procedure: COLONOSCOPY WITH PROPOFOL;  Surgeon: 05/23/2019, MD;  Location: [facility];  Service: Endoscopy;  Laterality: N/A;    No family history on file.  Social History   Tobacco Use  . Smoking status: Current Every Day Smoker    Packs/day: 0.25    Years: 9.00    Pack years: 2.25    Types: Cigarettes  . Smokeless tobacco: Never Used  . Tobacco comment: 4-5 cigarettes per day  Substance Use Topics  . Alcohol use: Yes    Comment: occ  . Drug use: Not Currently    Types: Marijuana    Allergies:  Allergies  Allergen Reactions  . Penicillin G Anaphylaxis    Unknown  . Penicillins     Hands and feet peeled Did it involve swelling of the face/tongue/throat, SOB, or low BP? No Did it involve sudden or severe rash/hives, skin peeling, or any reaction on the inside of your mouth or nose? Yes Did you need to seek medical attention at a hospital or  doctor's office? Unknown When did it last happen?Childhood allergy If all above answers are "NO", may proceed with cephalosporin use.     Medications Prior to Admission  Medication Sig Dispense Refill Last Dose  . acetaminophen (TYLENOL) 500 MG tablet Take 1,000 mg by mouth every 6 (six) hours as needed for headache (cramps).   07/05/2019 at Unknown time  . labetalol (NORMODYNE) 200 MG tablet Take 1 tablet (200 mg total) by mouth 2 (two) times daily. 60 tablet 1 07/05/2019 at Unknown time  . ferrous sulfate (FERROUSUL) 325 (65 FE) MG tablet Take 1 tablet (325 mg total) by mouth daily with breakfast. 30 tablet 1   . Prenatal Vit-Fe Fumarate-FA (PREPLUS) 27-1 MG TABS Take 1 tablet by mouth daily. 30 tablet 11     Review of Systems  Constitutional: Positive for unexpected weight change. Negative for chills and fever.  HENT: Negative for sore throat.   Respiratory: Positive for cough. Negative for shortness of breath.   Cardiovascular: Negative for chest pain.  Gastrointestinal: Positive for nausea and vomiting. Negative for abdominal pain.  Genitourinary:  Negative for vaginal bleeding.  Neurological: Positive for headaches.   Physical Exam   Blood pressure 111/67, pulse 95, temperature 99.2 F (37.3 C), temperature source Oral, resp. rate 20, height 5\' 7"  (1.702 m), weight (!) 154.8 kg, last menstrual period 05/05/2019, SpO2 99 %.  Physical Exam  Nursing note and vitals reviewed. Constitutional: She is oriented to person, place, and time. She appears well-developed and well-nourished. No distress.  HENT:  Head: Normocephalic and atraumatic.  Nose: Nose normal.  Mouth/Throat: Uvula is midline, oropharynx is clear and moist and mucous membranes are normal.  Cardiovascular: Normal rate, regular rhythm and normal heart sounds.  Respiratory: Effort normal. No respiratory distress. She has no wheezes. She has no rales.  Musculoskeletal:        General: Normal range of motion.   Neurological: She is alert and oriented to person, place, and time. She has normal reflexes. No cranial nerve deficit.   Results for orders placed or performed during the hospital encounter of 07/05/19 (from the past 24 hour(s))  Urinalysis, Routine w reflex microscopic     Status: Abnormal   Collection Time: 07/05/19  3:10 PM  Result Value Ref Range   Color, Urine AMBER (A) YELLOW   APPearance HAZY (A) CLEAR   Specific Gravity, Urine 1.030 1.005 - 1.030   pH 5.0 5.0 - 8.0   Glucose, UA NEGATIVE NEGATIVE mg/dL   Hgb urine dipstick NEGATIVE NEGATIVE   Bilirubin Urine NEGATIVE NEGATIVE   Ketones, ur 20 (A) NEGATIVE mg/dL   Protein, ur 100 (A) NEGATIVE mg/dL   Nitrite NEGATIVE NEGATIVE   Leukocytes,Ua NEGATIVE NEGATIVE   RBC / HPF 0-5 0 - 5 RBC/hpf   WBC, UA 0-5 0 - 5 WBC/hpf   Bacteria, UA FEW (A) NONE SEEN   Squamous Epithelial / LPF 11-20 0 - 5   Mucus PRESENT   CBC     Status: Abnormal   Collection Time: 07/05/19  3:41 PM  Result Value Ref Range   WBC 3.5 (L) 4.0 - 10.5 K/uL   RBC 3.92 3.87 - 5.11 MIL/uL   Hemoglobin 11.8 (L) 12.0 - 15.0 g/dL   HCT 34.6 (L) 36.0 - 46.0 %   MCV 88.3 80.0 - 100.0 fL   MCH 30.1 26.0 - 34.0 pg   MCHC 34.1 30.0 - 36.0 g/dL   RDW 12.4 11.5 - 15.5 %   Platelets 231 150 - 400 K/uL   nRBC 0.0 0.0 - 0.2 %  Basic metabolic panel     Status: Abnormal   Collection Time: 07/05/19  3:41 PM  Result Value Ref Range   Sodium 135 135 - 145 mmol/L   Potassium 4.3 3.5 - 5.1 mmol/L   Chloride 102 98 - 111 mmol/L   CO2 20 (L) 22 - 32 mmol/L   Glucose, Bld 86 70 - 99 mg/dL   BUN 6 6 - 20 mg/dL   Creatinine, Ser 0.70 0.44 - 1.00 mg/dL   Calcium 8.2 (L) 8.9 - 10.3 mg/dL   GFR calc non Af Amer >60 >60 mL/min   GFR calc Af Amer >60 >60 mL/min   Anion gap 13 5 - 15   MAU Course  Procedures Meds ordered this encounter  Medications  . lactated ringers bolus 1,000 mL  . metoCLOPramide (REGLAN) injection 10 mg  . diphenhydrAMINE (BENADRYL) injection 25 mg   . dexamethasone (DECADRON) injection 10 mg  . promethazine (PHENERGAN) 25 MG tablet    Sig: Take 0.5-1 tablets (12.5-25 mg total) by  mouth every 6 (six) hours as needed for nausea or vomiting.    Dispense:  30 tablet    Refill:  0    Order Specific Question:   Supervising Provider    Answer:   Adam PhenixARNOLD, JAMES G [[id number]]   MDM Labs ordered and reviewed. Normal neuro exam. BP well controlled. No anemia. HA improved with hydration and meds. Consider oupt Neuro consult if HA persists. Covid test pending. Stable for discharge home.   Assessment and Plan   1. [redacted] weeks gestation of pregnancy   2. Acute non intractable tension-type headache   3. Cough   4. Nausea and vomiting during pregnancy    Discharge home Follow up at [facility] as scheduled Return to ED if HA persists Return to MAU for OB concerns Rx Phenergan  Allergies as of 07/05/2019      Reactions   Penicillin G Anaphylaxis   Unknown   Penicillins    Hands and feet peeled Did it involve swelling of the face/tongue/throat, SOB, or low BP? No Did it involve sudden or severe rash/hives, skin peeling, or any reaction on the inside of your mouth or nose? Yes Did you need to seek medical attention at a hospital or doctor's office? Unknown When did it last happen?Childhood allergy If all above answers are "NO", may proceed with cephalosporin use.      Medication List    STOP taking these medications   ferrous sulfate 325 (65 FE) MG tablet Commonly known as: FerrouSul     TAKE these medications   acetaminophen 500 MG tablet Commonly known as: TYLENOL Take 1,000 mg by mouth every 6 (six) hours as needed for headache (cramps).   labetalol 200 MG tablet Commonly known as: NORMODYNE Take 1 tablet (200 mg total) by mouth 2 (two) times daily.   PrePLUS 27-1 MG Tabs Take 1 tablet by mouth daily.   promethazine 25 MG tablet Commonly known as: PHENERGAN Take 0.5-1 tablets (12.5-25 mg total) by mouth every 6 (six) hours as  needed for nausea or vomiting.      Donette LarryMelanie Uziah Sorter, CNM 07/05/2019, 6:41 PM

## 2019-07-05 NOTE — MAU Note (Signed)
Unable to void at this time.

## 2019-07-08 ENCOUNTER — Ambulatory Visit (INDEPENDENT_AMBULATORY_CARE_PROVIDER_SITE_OTHER): Payer: Medicaid Other

## 2019-07-08 ENCOUNTER — Other Ambulatory Visit: Payer: Self-pay

## 2019-07-08 ENCOUNTER — Ambulatory Visit (HOSPITAL_COMMUNITY)
Admission: RE | Admit: 2019-07-08 | Discharge: 2019-07-08 | Disposition: A | Discharge status: Home / Self Care | Payer: Medicaid Other | Source: Ambulatory Visit | Attending: Student | Admitting: Student

## 2019-07-08 DIAGNOSIS — O26891 Other specified pregnancy related conditions, first trimester: Secondary | ICD-10-CM

## 2019-07-08 DIAGNOSIS — Z3A01 Less than 8 weeks gestation of pregnancy: Secondary | ICD-10-CM | POA: Diagnosis not present

## 2019-07-08 DIAGNOSIS — Z3201 Encounter for pregnancy test, result positive: Secondary | ICD-10-CM

## 2019-07-08 DIAGNOSIS — R519 Headache, unspecified: Secondary | ICD-10-CM | POA: Diagnosis not present

## 2019-07-08 DIAGNOSIS — O10919 Unspecified pre-existing hypertension complicating pregnancy, unspecified trimester: Secondary | ICD-10-CM | POA: Diagnosis not present

## 2019-07-08 DIAGNOSIS — O3680X0 Pregnancy with inconclusive fetal viability, not applicable or unspecified: Secondary | ICD-10-CM

## 2019-07-08 DIAGNOSIS — Z3689 Encounter for other specified antenatal screening: Secondary | ICD-10-CM | POA: Diagnosis not present

## 2019-07-08 NOTE — Progress Notes (Signed)
Pt here today following dating [place] for results. Pt is [redacted]w[redacted]d based off of LMP. US showed pt to be [redacted]w[redacted]d.   Results of [place] reviewed with Rip Harbour, MD, who states results are uncertain for fetal viability. Dr. Rip Harbour states pt will need a follow-up US in 2 weeks to assess viability.   Results and provider recommendation reviewed with pt. [place] scheduled for 07/24/18 at 1100, pt to arrive at 1045. Pt states she would like to receive care in our office. I explained to pt that we will schedule future appts after [place] on 07/24/18. Pt verbalizes understanding and plans to cancel new OB appt with [facility] office.   Apolonio Schneiders RN 07/08/19

## 2019-07-11 NOTE — Progress Notes (Signed)
Agree with A & P. 

## 2019-07-15 ENCOUNTER — Encounter (HOSPITAL_COMMUNITY): Payer: Self-pay | Admitting: Family Medicine

## 2019-07-15 ENCOUNTER — Inpatient Hospital Stay (HOSPITAL_COMMUNITY)
Admission: AD | Admit: 2019-07-15 | Discharge: 2019-07-15 | Disposition: A | Discharge status: Home / Self Care | Payer: Medicaid Other | Attending: Family Medicine | Admitting: Family Medicine

## 2019-07-15 ENCOUNTER — Other Ambulatory Visit: Payer: Self-pay

## 2019-07-15 ENCOUNTER — Inpatient Hospital Stay (HOSPITAL_COMMUNITY): Payer: Medicaid Other

## 2019-07-15 DIAGNOSIS — N760 Acute vaginitis: Secondary | ICD-10-CM | POA: Diagnosis not present

## 2019-07-15 DIAGNOSIS — N939 Abnormal uterine and vaginal bleeding, unspecified: Secondary | ICD-10-CM

## 2019-07-15 DIAGNOSIS — B9689 Other specified bacterial agents as the cause of diseases classified elsewhere: Secondary | ICD-10-CM | POA: Diagnosis not present

## 2019-07-15 DIAGNOSIS — O09521 Supervision of elderly multigravida, first trimester: Secondary | ICD-10-CM | POA: Insufficient documentation

## 2019-07-15 DIAGNOSIS — O99331 Smoking (tobacco) complicating pregnancy, first trimester: Secondary | ICD-10-CM | POA: Diagnosis not present

## 2019-07-15 DIAGNOSIS — O26851 Spotting complicating pregnancy, first trimester: Secondary | ICD-10-CM | POA: Diagnosis not present

## 2019-07-15 DIAGNOSIS — F17210 Nicotine dependence, cigarettes, uncomplicated: Secondary | ICD-10-CM | POA: Diagnosis not present

## 2019-07-15 DIAGNOSIS — Z79899 Other long term (current) drug therapy: Secondary | ICD-10-CM | POA: Insufficient documentation

## 2019-07-15 DIAGNOSIS — O23591 Infection of other part of genital tract in pregnancy, first trimester: Secondary | ICD-10-CM | POA: Diagnosis not present

## 2019-07-15 DIAGNOSIS — Z3A01 Less than 8 weeks gestation of pregnancy: Secondary | ICD-10-CM | POA: Insufficient documentation

## 2019-07-15 DIAGNOSIS — O209 Hemorrhage in early pregnancy, unspecified: Secondary | ICD-10-CM | POA: Diagnosis present

## 2019-07-15 LAB — URINALYSIS, ROUTINE W REFLEX MICROSCOPIC
Bacteria, UA: NONE SEEN
Bilirubin Urine: NEGATIVE
Glucose, UA: NEGATIVE mg/dL
Ketones, ur: NEGATIVE mg/dL
Leukocytes,Ua: NEGATIVE
Nitrite: NEGATIVE
Protein, ur: NEGATIVE mg/dL
Specific Gravity, Urine: 1.024 (ref 1.005–1.030)
pH: 5 (ref 5.0–8.0)

## 2019-07-15 LAB — COMPREHENSIVE METABOLIC PANEL
ALT: 30 U/L (ref 0–44)
AST: 18 U/L (ref 15–41)
Albumin: 3 g/dL — ABNORMAL LOW (ref 3.5–5.0)
Alkaline Phosphatase: 44 U/L (ref 38–126)
Anion gap: 9 (ref 5–15)
BUN: 10 mg/dL (ref 6–20)
CO2: 24 mmol/L (ref 22–32)
Calcium: 9.3 mg/dL (ref 8.9–10.3)
Chloride: 104 mmol/L (ref 98–111)
Creatinine, Ser: 0.68 mg/dL (ref 0.44–1.00)
GFR calc Af Amer: >60 mL/min (ref >60)
GFR calc non Af Amer: >60 mL/min (ref >60)
Glucose, Bld: 102 mg/dL — ABNORMAL HIGH (ref 70–99)
Potassium: 4 mmol/L (ref 3.5–5.1)
Sodium: 137 mmol/L (ref 135–145)
Total Bilirubin: 0.5 mg/dL (ref 0.3–1.2)
Total Protein: 6.1 g/dL — ABNORMAL LOW (ref 6.5–8.1)

## 2019-07-15 LAB — CBC
HCT: 31.3 % — ABNORMAL LOW (ref 36.0–46.0)
Hemoglobin: 10.6 g/dL — ABNORMAL LOW (ref 12.0–15.0)
MCH: 30.3 pg (ref 26.0–34.0)
MCHC: 33.9 g/dL (ref 30.0–36.0)
MCV: 89.4 fL (ref 80.0–100.0)
Platelets: 406 10*3/uL — ABNORMAL HIGH (ref 150–400)
RBC: 3.5 MIL/uL — ABNORMAL LOW (ref 3.87–5.11)
RDW: 12.8 % (ref 11.5–15.5)
WBC: 6.7 10*3/uL (ref 4.0–10.5)
nRBC: 0 % (ref 0.0–0.2)

## 2019-07-15 LAB — WET PREP, GENITAL
Sperm: NONE SEEN
Trich, Wet Prep: NONE SEEN
Yeast Wet Prep HPF POC: NONE SEEN

## 2019-07-15 LAB — HCG, QUANTITATIVE, PREGNANCY: hCG, Beta Chain, Quant, S: 11843 m[IU]/mL — ABNORMAL HIGH (ref <5)

## 2019-07-15 LAB — ABO/RH: ABO/RH(D): B POS

## 2019-07-15 MED ORDER — METRONIDAZOLE 500 MG PO TABS: 500.0000 mg | ORAL_TABLET | Freq: Two times a day (BID) | ORAL | 0 refills | Status: AC

## 2019-07-15 NOTE — MAU Provider Note (Addendum)
History     CSN: [id number]  Arrival date and time: 07/15/19 1755   First Provider Initiated Contact with Patient 07/15/19 1836      Chief Complaint  Patient presents with  . Vaginal Bleeding  . Abdominal Pain   Ms. Vickie Ford is a 38 y.o. [id number] at 5541w1d who presents to MAU for vaginal bleeding which began around 5PM today. Pt reports she is not bleeding at this time. Pt describes bleeding as light-pink spotting.  Passing blood clots? no Blood soaking clothes? no Lightheaded/dizzy? no Significant pelvic pain or cramping? yes, feels like menstrual cramps Passed any tissue? No  Current pregnancy problems? pt has not yet been seen Blood Type? unknown Allergies? PCN Current medications? Labetalol 200mg  BID, Fe, PNVs  Pt denies vaginal discharge/odor/itching. Pt denies N/V, abdominal pain, constipation, diarrhea, or urinary problems. Pt denies fever, chills, fatigue, sweating or changes in appetite. Pt denies SOB or chest pain. Pt denies dizziness, HA, light-headedness, weakness.   OB History    Gravida  4   Para  2   Term  2   Preterm      AB  1   Living  2     SAB      TAB  1   Ectopic      Multiple      Live Births  2           Past Medical History:  Diagnosis Date  . Anemia   . Headache   . Hemorrhoid   . Hypertension     Past Surgical History:  Procedure Laterality Date  . CESAREAN SECTION    . COLONOSCOPY WITH PROPOFOL N/A 03/23/2019   Procedure: COLONOSCOPY WITH PROPOFOL;  Surgeon: Willis Modenautlaw, William, MD;  Location: [facility];  Service: Endoscopy;  Laterality: N/A;    No family history on file.  Social History   Tobacco Use  . Smoking status: Current Every Day Smoker    Packs/day: 0.25    Years: 9.00    Pack years: 2.25    Types: Cigarettes  . Smokeless tobacco: Never Used  . Tobacco comment: 4-5 cigarettes per day  Substance Use Topics  . Alcohol use: Yes    Comment: occ  . Drug use: Not Currently    Types:  Marijuana    Allergies:  Allergies  Allergen Reactions  . Penicillin G Anaphylaxis    Unknown  . Penicillins     Hands and feet peeled Did it involve swelling of the face/tongue/throat, SOB, or low BP? No Did it involve sudden or severe rash/hives, skin peeling, or any reaction on the inside of your mouth or nose? Yes Did you need to seek medical attention at a hospital or doctor's office? Unknown When did it last happen?Childhood allergy If all above answers are "NO", may proceed with cephalosporin use.     Medications Prior to Admission  Medication Sig Dispense Refill Last Dose  . labetalol (NORMODYNE) 200 MG tablet Take 1 tablet (200 mg total) by mouth 2 (two) times daily. 60 tablet 1 07/15/2019 at Unknown time  . Prenatal Vit-Fe Fumarate-FA (PREPLUS) 27-1 MG TABS Take 1 tablet by mouth daily. 30 tablet 11 07/15/2019 at Unknown time  . acetaminophen (TYLENOL) 500 MG tablet Take 1,000 mg by mouth every 6 (six) hours as needed for headache (cramps).   Unknown at Unknown time  . promethazine (PHENERGAN) 25 MG tablet Take 0.5-1 tablets (12.5-25 mg total) by mouth every 6 (six) hours as needed for nausea  or vomiting. 30 tablet 0 Unknown at Unknown time    Review of Systems  Constitutional: Negative for chills, diaphoresis, fatigue and fever.  Eyes: Negative for visual disturbance.  Respiratory: Negative for shortness of breath.   Cardiovascular: Negative for chest pain.  Gastrointestinal: Negative for abdominal pain, constipation, diarrhea, nausea and vomiting.  Genitourinary: Positive for pelvic pain (menstrual-like cramping) and vaginal bleeding. Negative for dysuria, flank pain, frequency, urgency and vaginal discharge.  Neurological: Negative for dizziness, weakness, light-headedness and headaches.   Physical Exam   Blood pressure (!) 155/85, pulse 75, temperature 98.5 F (36.9 C), temperature source Oral, resp. rate 18, weight (!) 161.6 kg, last menstrual period  05/05/2019, SpO2 100 %.  Patient Vitals for the past 24 hrs:  BP Temp Temp src Pulse Resp SpO2 Weight  07/15/19 1823 (!) 155/85 - - 75 - - -  07/15/19 1805 (!) 143/78 98.5 F (36.9 C) Oral 80 18 100 % (!) 161.6 kg   Physical Exam  Constitutional: She is oriented to person, place, and time. She appears well-developed and well-nourished. No distress.  HENT:  Head: Normocephalic and atraumatic.  Respiratory: Effort normal.  GI: Soft. She exhibits no distension and no mass. There is no abdominal tenderness. There is no rebound and no guarding.  Neurological: She is alert and oriented to person, place, and time.  Skin: Skin is warm and dry. She is not diaphoretic.  Psychiatric: She has a normal mood and affect. Her behavior is normal. Judgment and thought content normal.  Pelvic exam deferred d/t short duration of bleeding and lack of bleeding while in MAU.  Results for orders placed or performed during the hospital encounter of 07/15/19 (from the past 24 hour(s))  Urinalysis, Routine w reflex microscopic     Status: Abnormal   Collection Time: 07/15/19  6:16 PM  Result Value Ref Range   Color, Urine YELLOW YELLOW   APPearance HAZY (A) CLEAR   Specific Gravity, Urine 1.024 1.005 - 1.030   pH 5.0 5.0 - 8.0   Glucose, UA NEGATIVE NEGATIVE mg/dL   Hgb urine dipstick MODERATE (A) NEGATIVE   Bilirubin Urine NEGATIVE NEGATIVE   Ketones, ur NEGATIVE NEGATIVE mg/dL   Protein, ur NEGATIVE NEGATIVE mg/dL   Nitrite NEGATIVE NEGATIVE   Leukocytes,Ua NEGATIVE NEGATIVE   RBC / HPF 0-5 0 - 5 RBC/hpf   WBC, UA 0-5 0 - 5 WBC/hpf   Bacteria, UA NONE SEEN NONE SEEN   Squamous Epithelial / LPF 0-5 0 - 5   Mucus PRESENT   CBC     Status: Abnormal   Collection Time: 07/15/19  6:36 PM  Result Value Ref Range   WBC 6.7 4.0 - 10.5 K/uL   RBC 3.50 (L) 3.87 - 5.11 MIL/uL   Hemoglobin 10.6 (L) 12.0 - 15.0 g/dL   HCT 06.2 (L) 69.4 - 85.4 %   MCV 89.4 80.0 - 100.0 fL   MCH 30.3 26.0 - 34.0 pg   MCHC  33.9 30.0 - 36.0 g/dL   RDW 62.7 03.5 - 00.9 %   Platelets 406 (H) 150 - 400 K/uL   nRBC 0.0 0.0 - 0.2 %  ABO/Rh     Status: None   Collection Time: 07/15/19  6:36 PM  Result Value Ref Range   ABO/RH(D)      B POS Performed at [facility], [address], [place], [place] [address]    US OB LESS THAN 14 WEEKS WITH [place] TRANSVAGINAL  Result Date: 07/08/2019  CLINICAL DATA:  Uncertain dates. Positive urine pregnancy test. Quantitative beta HCG unknown. Estimated gestational age per LMP 9 weeks 1 day. EXAM: OBSTETRIC <14 WK [place] AND TRANSVAGINAL OB US TECHNIQUE: Both transabdominal and transvaginal ultrasound examinations were performed for complete evaluation of the gestation as well as the maternal uterus, adnexal regions, and pelvic cul-de-sac. Transvaginal technique was performed to assess early pregnancy. COMPARISON:  None. FINDINGS: Intrauterine gestational sac: Single visualized. Yolk sac:  Visualized. Embryo:  Not visualized. Cardiac Activity: Not visualized. Heart Rate: Not visualized. MSD: 11.7 mm   5 w   6 d Subchorionic hemorrhage:  None visualized. Maternal uterus/adnexae: Right ovary is within normal. Left ovary not visualized due to abundant overlying bowel gas and patient body habitus. Small amount of free pelvic fluid. IMPRESSION: 1. Intrauterine gestational sac and yolk sac visualized. No embryo or cardiac activity. Mean sac diameter compatible with estimated gestational age [redacted] weeks 6 days. Findings may be seen with normal early pregnancy versus failed pregnancy. Recommend correlation with quantitative beta HCG and follow-up serial quantitative HCG as well as follow-up ultrasound 2 weeks. Electronically Signed   By: Elberta Fortis M.D.   On: 07/08/2019 09:58   MAU Course  Procedures  MDM -r/o ectopic with elevated BP on admission -UA: hazy/mod hgb, urine sent for culture -CBC: pending at time of care transfer -CMP: pending at time of care transfer -[place]: pending at time of care  transfer -hCG: pending at time of care transfer -ABO: B Positive -WetPrep: +ClueCells, otherwise WNL -GC/CT collected  -care transferred to Dr. Salomon Mast  Loralee Pacas, Odie Sera, NP  8:04 PM 07/15/2019  -Care assumed from Donia Ast, NP   Results for orders placed or performed during the hospital encounter of 07/15/19 (from the past 24 hour(s))  Urinalysis, Routine w reflex microscopic     Status: Abnormal   Collection Time: 07/15/19  6:16 PM  Result Value Ref Range   Color, Urine YELLOW YELLOW   APPearance HAZY (A) CLEAR   Specific Gravity, Urine 1.024 1.005 - 1.030   pH 5.0 5.0 - 8.0   Glucose, UA NEGATIVE NEGATIVE mg/dL   Hgb urine dipstick MODERATE (A) NEGATIVE   Bilirubin Urine NEGATIVE NEGATIVE   Ketones, ur NEGATIVE NEGATIVE mg/dL   Protein, ur NEGATIVE NEGATIVE mg/dL   Nitrite NEGATIVE NEGATIVE   Leukocytes,Ua NEGATIVE NEGATIVE   RBC / HPF 0-5 0 - 5 RBC/hpf   WBC, UA 0-5 0 - 5 WBC/hpf   Bacteria, UA NONE SEEN NONE SEEN   Squamous Epithelial / LPF 0-5 0 - 5   Mucus PRESENT   CBC     Status: Abnormal   Collection Time: 07/15/19  6:36 PM  Result Value Ref Range   WBC 6.7 4.0 - 10.5 K/uL   RBC 3.50 (L) 3.87 - 5.11 MIL/uL   Hemoglobin 10.6 (L) 12.0 - 15.0 g/dL   HCT 16.1 (L) 09.6 - 04.5 %   MCV 89.4 80.0 - 100.0 fL   MCH 30.3 26.0 - 34.0 pg   MCHC 33.9 30.0 - 36.0 g/dL   RDW 40.9 81.1 - 91.4 %   Platelets 406 (H) 150 - 400 K/uL   nRBC 0.0 0.0 - 0.2 %  Comprehensive metabolic panel     Status: Abnormal   Collection Time: 07/15/19  6:36 PM  Result Value Ref Range   Sodium 137 135 - 145 mmol/L   Potassium 4.0 3.5 - 5.1 mmol/L   Chloride 104 98 - 111 mmol/L   CO2 24 22 -  32 mmol/L   Glucose, Bld 102 (H) 70 - 99 mg/dL   BUN 10 6 - 20 mg/dL   Creatinine, Ser 0.68 0.44 - 1.00 mg/dL   Calcium 9.3 8.9 - 10.3 mg/dL   Total Protein 6.1 (L) 6.5 - 8.1 g/dL   Albumin 3.0 (L) 3.5 - 5.0 g/dL   AST 18 15 - 41 U/L   ALT 30 0 - 44 U/L   Alkaline Phosphatase 44 38  - 126 U/L   Total Bilirubin 0.5 0.3 - 1.2 mg/dL   GFR calc non Af Amer >60 >60 mL/min   GFR calc Af Amer >60 >60 mL/min   Anion gap 9 5 - 15  ABO/Rh     Status: None   Collection Time: 07/15/19  6:36 PM  Result Value Ref Range   ABO/RH(D)      B POS Performed at [facility] [address], [place], [place] [address]   hCG, quantitative, pregnancy     Status: Abnormal   Collection Time: 07/15/19  6:36 PM  Result Value Ref Range   hCG, Beta Chain, Quant, S 11,843 (H) <5 mIU/mL  Wet prep, genital     Status: Abnormal   Collection Time: 07/15/19  6:40 PM   Specimen: Vaginal  Result Value Ref Range   Yeast Wet Prep HPF POC NONE SEEN NONE SEEN   Trich, Wet Prep NONE SEEN NONE SEEN   Clue Cells Wet Prep HPF POC PRESENT (A) NONE SEEN   WBC, Wet Prep HPF POC MANY (A) NONE SEEN   Sperm NONE SEEN    -Transvaginal US showed a SIUP at [redacted]w[redacted]d. [place] on 07/08/19 showed a SIUP at [redacted]w[redacted]d, which is an appropriate amount of growth for early pregnancy. -Wet prep shows concern for BV, patient has had multiple episodes of BV and would like to be treated for this.  Assessment and Plan  Quinnetta is a 38 yo G4P2012 at [redacted]w[redacted]d by [place] who presented to MAU complaining of one episode of light-pink vaginal spotting. -will call with culture results, if positive -[place] scheduled 07/24/2018 -Script for Flagyl sent to treat BV, also advised taking a probiotic -BP improved with news that pregnancy was growing appropriately, continue current management. She was advised to check her BP regularly, and to call OB office for BP >150/>90. -DC home with return precautions -Follow up with OB provider as scheduled on 07/25/2019 for [place].   Merilyn Baba, DO OB Fellow, [facility] 07/15/2019 10:00 PM

## 2019-07-15 NOTE — MAU Note (Addendum)
Noted light pink when wiped after using the bathroom.  Had a little cramp. Just wanted to get checked and make sure the baby was all right.

## 2019-07-15 NOTE — Discharge Instructions (Signed)
Vaginal Bleeding During Pregnancy, First Trimester  A small amount of bleeding (spotting) from the vagina is common during early pregnancy. Sometimes the bleeding is normal and does not cause problems. At other times, though, bleeding may be a sign of something serious. Tell your doctor about any bleeding from your vagina right away. Follow these instructions at home: Activity  Follow your doctor's instructions about how active you can be.  If needed, make plans for someone to help with your normal activities.  Do not have sex or orgasms until your doctor says that this is safe. General instructions  Take over-the-counter and prescription medicines only as told by your doctor.  Watch your condition for any changes.  Write down: ? The number of pads you use each day. ? How often you change pads. ? How soaked (saturated) your pads are.  Do not use tampons.  Do not douche.  If you pass any tissue from your vagina, save it to show to your doctor.  Keep all follow-up visits as told by your doctor. This is important. Contact a doctor if:  You have vaginal bleeding at any time while you are pregnant.  You have cramps.  You have a fever. Get help right away if:  You have very bad cramps in your back or belly (abdomen).  You pass large clots or a lot of tissue from your vagina.  Your bleeding gets worse.  You feel light-headed.  You feel weak.  You pass out (faint).  You have chills.  You are leaking fluid from your vagina.  You have a gush of fluid from your vagina. Summary  Sometimes vaginal bleeding during pregnancy is normal and does not cause problems. At other times, bleeding may be a sign of something serious.  Tell your doctor about any bleeding from your vagina right away.  Follow your doctor's instructions about how active you can be. You may need someone to help you with your normal activities. This information is not intended to replace advice given to  you by your health care provider. Make sure you discuss any questions you have with your health care provider. Document Released: 11/21/2013 Document Revised: 10/26/2018 Document Reviewed: 10/08/2016 Elsevier Patient Education  2020 Else[employer] Inc[employer]   Bacterial Vaginosis  Bacterial vaginosis is an infection of the vagina. It happens when too many normal germs (healthy bacteria) grow in the vagina. This infection puts you at risk for infections from sex (STIs). Treating this infection can lower your risk for some STIs. You should also treat this if you are pregnant. It can cause your baby to be born early. Follow these instructions at home: Medicines  Take over-the-counter and prescription medicines only as told by your doctor.  Take or use your antibiotic medicine as told by your doctor. Do not stop taking or using it even if you start to feel better. General instructions  If you your sexual partner is a woman, tell her that you have this infection. She needs to get treatment if she has symptoms. If you have a female partner, he does not need to be treated.  During treatment: ? Avoid sex. ? Do not douche. ? Avoid alcohol as told. ? Avoid breastfeeding as told.  Drink enough fluid to keep your pee (urine) clear or pale yellow.  Keep your vagina and butt (rectum) clean. ? Wash the area with warm water every day. ? Wipe from front to back after you use the toilet.  Keep all follow-up visits as told  by your doctor. This is important. Preventing this condition  Do not douche.  Use only warm water to wash around your vagina.  Use protection when you have sex. This includes: ? Latex condoms. ? Dental dams.  Limit how many people you have sex with. It is best to only have sex with the same person (be monogamous).  Get tested for STIs. Have your partner get tested.  Wear underwear that is cotton or lined with cotton.  Avoid tight pants and pantyhose. This is most important in  summer.  Do not use any products that have nicotine or tobacco in them. These include cigarettes and e-cigarettes. If you need help quitting, ask your doctor.  Do not use illegal drugs.  Limit how much alcohol you drink. Contact a doctor if:  Your symptoms do not get better, even after you are treated.  You have more discharge or pain when you pee (urinate).  You have a fever.  You have pain in your belly (abdomen).  You have pain with sex.  Your bleed from your vagina between periods. Summary  This infection happens when too many germs (bacteria) grow in the vagina.  Treating this condition can lower your risk for some infections from sex (STIs).  You should also treat this if you are pregnant. It can cause early (premature) birth.  Do not stop taking or using your antibiotic medicine even if you start to feel better. This information is not intended to replace advice given to you by your health care provider. Make sure you discuss any questions you have with your health care provider. Document Released: 04/15/2008 Document Revised: 06/19/2017 Document Reviewed: 03/22/2016 Elsevier Patient Education  2020 [employer].

## 2019-07-16 ENCOUNTER — Inpatient Hospital Stay (HOSPITAL_COMMUNITY)
Admission: AD | Admit: 2019-07-16 | Discharge: 2019-07-16 | Disposition: A | Discharge status: Home / Self Care | Payer: Medicaid Other | Attending: Obstetrics and Gynecology | Admitting: Obstetrics and Gynecology

## 2019-07-16 ENCOUNTER — Encounter (HOSPITAL_COMMUNITY): Payer: Self-pay | Admitting: Obstetrics and Gynecology

## 2019-07-16 ENCOUNTER — Other Ambulatory Visit: Payer: Self-pay

## 2019-07-16 DIAGNOSIS — F17210 Nicotine dependence, cigarettes, uncomplicated: Secondary | ICD-10-CM | POA: Diagnosis not present

## 2019-07-16 DIAGNOSIS — O209 Hemorrhage in early pregnancy, unspecified: Secondary | ICD-10-CM | POA: Diagnosis present

## 2019-07-16 DIAGNOSIS — Z880 Allergy status to penicillin: Secondary | ICD-10-CM | POA: Diagnosis not present

## 2019-07-16 DIAGNOSIS — O039 Complete or unspecified spontaneous abortion without complication: Secondary | ICD-10-CM | POA: Diagnosis not present

## 2019-07-16 DIAGNOSIS — O029 Abnormal product of conception, unspecified: Secondary | ICD-10-CM | POA: Diagnosis not present

## 2019-07-16 DIAGNOSIS — Z79899 Other long term (current) drug therapy: Secondary | ICD-10-CM | POA: Diagnosis not present

## 2019-07-16 NOTE — MAU Note (Signed)
Vickie Ford is a 38 y.o. at [redacted]w[redacted]d here in MAU reporting:  That she is now bleeding more compared to her MAU visit last night. Endorses that is it similar to a period for her.  +lower abdominal menstrual like cramps. Onset of complaint: 11pm Pain score: 5/10. Tried tylenol with some relief. Vitals:   07/16/19 1159  BP: 136/83  Pulse: 88  Resp: 18  Temp: 98.1 F (36.7 C)  SpO2: 100%     Lab orders placed from triage: none

## 2019-07-16 NOTE — MAU Note (Signed)
ANORA test collected by RN using standard work process/Kit instructions. Verified by 2nd RN. [employer] called for pick up. Per Landen from [employer] there are no pickups available on Saturday. Earliest pick up available is Monday between Noon and 5pm. Informed him that kit needs to be refrigerated.  Confirmation #[id number]  RN relayed this information to [facility] whom transported specimen for refrigeration/pick up. 

## 2019-07-16 NOTE — Discharge Instructions (Signed)
Miscarriage °A miscarriage is the loss of an unborn baby (fetus) before the 20th week of pregnancy. °Follow these instructions at home: °Medicines ° °· Take over-the-counter and prescription medicines only as told by your doctor. °· If you were prescribed antibiotic medicine, take it as told by your doctor. Do not stop taking the antibiotic even if you start to feel better. °· Do not take NSAIDs unless your doctor says that this is safe for you. NSAIDs include aspirin and ibuprofen. These medicines can cause bleeding. °Activity °· Rest as directed. Ask your doctor what activities are safe for you. °· Have someone help you at home during this time. °General instructions °· Write down how many pads you use each day and how soaked they are. °· Watch the amount of tissue or clumps of blood (blood clots) that you pass from your vagina. Save any large amounts of tissue for your doctor. °· Do not use tampons, douche, or have sex until your doctor approves. °· To help you and your partner with the process of grieving, talk with your doctor or seek counseling. °· When you are ready, meet with your doctor to talk about steps you should take for your health. Also, talk with your doctor about steps to take to have a healthy pregnancy in the future. °· Keep all follow-up visits as told by your doctor. This is important. °Contact a doctor if: °· You have a fever or chills. °· You have vaginal discharge that smells bad. °· You have more bleeding. °Get help right away if: °· You have very bad cramps or pain in your back or belly. °· You pass clumps of blood that are walnut-sized or larger from your vagina. °· You pass tissue that is walnut-sized or larger from your vagina. °· You soak more than 1 regular pad in an hour. °· You get light-headed or weak. °· You faint (pass out). °· You have feelings of sadness that do not go away, or you have thoughts of hurting yourself. °Summary °· A miscarriage is the loss of an unborn baby before  the 20th week of pregnancy. °· Follow your doctor's instructions for home care. Keep all follow-up appointments. °· To help you and your partner with the process of grieving, talk with your doctor or seek counseling. °This information is not intended to replace advice given to you by your health care provider. Make sure you discuss any questions you have with your health care provider. °Document Released: 09/29/2011 Document Revised: 10/29/2018 Document Reviewed: 08/12/2016 °Elsevier Patient Education © 2020 Elsevier Inc. ° °

## 2019-07-16 NOTE — MAU Provider Note (Signed)
History     CSN: [id number]  Arrival date and time: 07/16/19 1145   First Provider Initiated Contact with Patient 07/16/19 1210      Chief Complaint  Patient presents with  . Vaginal Bleeding  . Abdominal Pain   HPI Vickie Ford is a 38 y.o. [id number] at [redacted]w[redacted]d who presents with vaginal bleeding. She was seen MAU yesterday with a gestational sac and yolk sac. She states last night, she started having cramping and bleeding like a period. She rates the pain a 4/10 and has not tried anything for the pain.   OB History     Gravida  4   Para  2   Term  2   Preterm      AB  1   Living  2      SAB      TAB  1   Ectopic      Multiple      Live Births  2           Past Medical History:  Diagnosis Date  . Anemia   . Headache   . Hemorrhoid   . Hypertension     Past Surgical History:  Procedure Laterality Date  . CESAREAN SECTION    . COLONOSCOPY WITH PROPOFOL N/A 03/23/2019   Procedure: COLONOSCOPY WITH PROPOFOL;  Surgeon: Arta Silence, MD;  Location: [facility];  Service: Endoscopy;  Laterality: N/A;    History reviewed. No pertinent family history.  Social History   Tobacco Use  . Smoking status: Current Every Day Smoker    Packs/day: 0.25    Years: 9.00    Pack years: 2.25    Types: Cigarettes  . Smokeless tobacco: Never Used  . Tobacco comment: 4-5 cigarettes per day  Substance Use Topics  . Alcohol use: Yes    Comment: occ  . Drug use: Not Currently    Types: Marijuana    Allergies:  Allergies  Allergen Reactions  . Penicillin G Anaphylaxis    Unknown  . Penicillins     Hands and feet peeled Did it involve swelling of the face/tongue/throat, SOB, or low BP? No Did it involve sudden or severe rash/hives, skin peeling, or any reaction on the inside of your mouth or nose? Yes Did you need to seek medical attention at a hospital or doctor's office? Unknown When did it last happen?      Childhood allergy If all above answers  are "NO", may proceed with cephalosporin use.     Medications Prior to Admission  Medication Sig Dispense Refill Last Dose  . acetaminophen (TYLENOL) 500 MG tablet Take 1,000 mg by mouth every 6 (six) hours as needed for headache (cramps).     . labetalol (NORMODYNE) 200 MG tablet Take 1 tablet (200 mg total) by mouth 2 (two) times daily. 60 tablet 1   . metroNIDAZOLE (FLAGYL) 500 MG tablet Take 1 tablet (500 mg total) by mouth 2 (two) times daily for 7 days. 14 tablet 0   . Prenatal Vit-Fe Fumarate-FA (PREPLUS) 27-1 MG TABS Take 1 tablet by mouth daily. 30 tablet 11   . promethazine (PHENERGAN) 25 MG tablet Take 0.5-1 tablets (12.5-25 mg total) by mouth every 6 (six) hours as needed for nausea or vomiting. 30 tablet 0     Review of Systems  Constitutional: Negative.  Negative for fatigue and fever.  HENT: Negative.   Respiratory: Negative.  Negative for shortness of breath.   Cardiovascular: Negative.  Negative for  chest pain.  Gastrointestinal: Positive for abdominal pain. Negative for constipation, diarrhea, nausea and vomiting.  Genitourinary: Positive for vaginal bleeding. Negative for dysuria.  Neurological: Negative.  Negative for dizziness and headaches.   Physical Exam   Blood pressure 136/83, pulse 88, temperature 98.1 F (36.7 C), temperature source Oral, resp. rate 18, last menstrual period 05/05/2019, SpO2 100 %.  Physical Exam  Nursing note and vitals reviewed. Constitutional: She is oriented to person, place, and time. She appears well-developed and well-nourished. No distress.  HENT:  Head: Normocephalic.  Eyes: Pupils are equal, round, and reactive to light.  Cardiovascular: Normal rate, regular rhythm and normal heart sounds.  Respiratory: Effort normal and breath sounds normal. No respiratory distress.  GI: Soft. Bowel sounds are normal. She exhibits no distension. There is no abdominal tenderness.  Genitourinary:    Genitourinary Comments: Moderate amount of  bright red vaginal bleeding in vault. Small amount of tissue removed from cervical os. No active bleeding after removal.    Neurological: She is alert and oriented to person, place, and time.  Skin: Skin is warm and dry.  Psychiatric: She has a normal mood and affect. Her behavior is normal. Judgment and thought content normal.    MAU Course  Procedures  MDM Tissue removed from cervix. No bleeding or pain after removal.  Vital signs stable. Labs all done less than 24 hours ago, no need to repeat at this time.   Offered patient Anora testing. Patient desires and paperwork completed.   Assessment and Plan   1. Miscarriage    -Discharge home in stable condition -Vaginal bleeding and pain precautions discussed -Patient advised to follow-up with [facility] in 1 week for HCG and 2 weeks with a provider -Patient may return to MAU as needed or if her condition were to change or worsen  Rolm Bookbinder CNM 07/16/2019, 12:10 PM

## 2019-07-17 LAB — CULTURE, OB URINE: Culture: <10000 — AB

## 2019-07-18 LAB — GC/CHLAMYDIA PROBE AMP (~~LOC~~) NOT AT ARMC
Chlamydia: NEGATIVE
Comment: NEGATIVE
Comment: NORMAL
Neisseria Gonorrhea: NEGATIVE

## 2019-07-25 ENCOUNTER — Ambulatory Visit (HOSPITAL_COMMUNITY): Payer: Medicaid Other

## 2019-07-25 ENCOUNTER — Other Ambulatory Visit: Payer: Medicaid Other

## 2019-07-25 ENCOUNTER — Encounter: Payer: Medicaid Other | Admitting: Obstetrics & Gynecology

## 2019-07-26 ENCOUNTER — Other Ambulatory Visit: Payer: Medicaid Other

## 2019-07-26 ENCOUNTER — Other Ambulatory Visit: Payer: Self-pay

## 2019-07-26 DIAGNOSIS — O039 Complete or unspecified spontaneous abortion without complication: Secondary | ICD-10-CM

## 2019-07-27 LAB — BETA HCG QUANT (REF LAB): hCG Quant: 146 m[IU]/mL

## 2019-08-02 ENCOUNTER — Ambulatory Visit (INDEPENDENT_AMBULATORY_CARE_PROVIDER_SITE_OTHER): Payer: Medicaid Other | Admitting: Obstetrics and Gynecology

## 2019-08-02 ENCOUNTER — Encounter: Payer: Self-pay | Admitting: Obstetrics and Gynecology

## 2019-08-02 ENCOUNTER — Other Ambulatory Visit: Payer: Self-pay

## 2019-08-02 VITALS — BP 126/83 | HR 70

## 2019-08-02 DIAGNOSIS — O039 Complete or unspecified spontaneous abortion without complication: Secondary | ICD-10-CM

## 2019-08-02 NOTE — Progress Notes (Signed)
GYNECOLOGY OFFICE FOLLOW UP NOTE  History:  39 y.o. [id number] here today for follow up for SAB dx 07/16/19. GS with yolk sac on 07/15/19. Had cramping and bleeding and tissue removed from cervix in  MAU on 07/16/19. Anora testing done. HCG done one week later was 146.   She is doing well, has not had further bleeding after the miscarriage, had cramping but is now feeling fine. No issues with pain, bathroom use, eating/drinking. Mentally, was not doing well and was depressed but feels she is doing better this week.   This was an unplanned pregnancy but she was happy about it. Does not want to use anything for contraception.   Past Medical History:  Diagnosis Date   Anemia    Headache    Hemorrhoid    Hypertension    Past Surgical History:  Procedure Laterality Date   CESAREAN SECTION     COLONOSCOPY WITH PROPOFOL N/A 03/23/2019   Procedure: COLONOSCOPY WITH PROPOFOL;  Surgeon: Willis Modena, MD;  Location: [facility];  Service: Endoscopy;  Laterality: N/A;    Current Outpatient Medications:    labetalol (NORMODYNE) 200 MG tablet, Take 1 tablet (200 mg total) by mouth 2 (two) times daily., Disp: 60 tablet, Rfl: 1   Prenatal Vit-Fe Fumarate-FA (PREPLUS) 27-1 MG TABS, Take 1 tablet by mouth daily., Disp: 30 tablet, Rfl: 11   promethazine (PHENERGAN) 25 MG tablet, Take 0.5-1 tablets (12.5-25 mg total) by mouth every 6 (six) hours as needed for nausea or vomiting., Disp: 30 tablet, Rfl: 0   acetaminophen (TYLENOL) 500 MG tablet, Take 1,000 mg by mouth every 6 (six) hours as needed for headache (cramps)., Disp: , Rfl:   The following portions of the patient's history were reviewed and updated as appropriate: allergies, current medications, past family history, past medical history, past social history, past surgical history and problem list.   Review of Systems:  Pertinent items noted in HPI and remainder of comprehensive ROS otherwise negative.   Objective:  Physical  Exam BP 126/83    Pulse 70    Breastfeeding Unknown  CONSTITUTIONAL: Well-developed, well-nourished female in no acute distress.  HENT:  Normocephalic, atraumatic. External right and left ear normal. Oropharynx is clear and moist EYES: Conjunctivae and EOM are normal. Pupils are equal, round, and reactive to light. No scleral icterus.  NECK: Normal range of motion, supple, no masses SKIN: Skin is warm and dry. No rash noted. Not diaphoretic. No erythema. No pallor. NEUROLOGIC: Alert and oriented to person, place, and time. Normal reflexes, muscle tone coordination. No cranial nerve deficit noted. PSYCHIATRIC: Normal mood and affect. Normal behavior. Normal judgment and thought content. CARDIOVASCULAR: Normal heart rate noted RESPIRATORY: Effort normal, no problems with respiration noted ABDOMEN: Soft, no distention noted.   PELVIC: deferred MUSCULOSKELETAL: Normal range of motion. No edema noted.  Labs and Imaging US OB LESS THAN 14 WEEKS WITH OB TRANSVAGINAL  Result Date: 07/15/2019 CLINICAL DATA:  First trimester pregnancy, vaginal spotting EXAM: OBSTETRIC <14 WK [place] AND TRANSVAGINAL OB US TECHNIQUE: Both transabdominal and transvaginal ultrasound examinations were performed for complete evaluation of the gestation as well as the maternal uterus, adnexal regions, and pelvic cul-de-sac. Transvaginal technique was performed to assess early pregnancy. COMPARISON:  07/08/2019 FINDINGS: Intrauterine gestational sac: Present, single Yolk sac:  Present Embryo:  Not visualized Cardiac Activity: N/A Heart Rate: N/A  bpm MSD: 18.4 mm   6 w   5 d Subchorionic hemorrhage:  None visualized. Maternal uterus/adnexae: Uterus otherwise unremarkable. RIGHT  ovary normal size and morphology, 3.8 x 2.3 x 3.1 cm. LEFT ovary normal size and morphology, 2.3 x 1.7 x 1.7 cm. No free pelvic fluid or adnexal masses. IMPRESSION: Single gestational sac identified within the uterus containing a small yolk sac. No fetal pole  identified. Based on mean sac diameter and absent fetal pole, findings are suspicious but not yet definitive for a failed pregnancy. Recommend follow-up US in 10-14 days for definitive diagnosis. This recommendation follows SRU consensus guidelines: Diagnostic Criteria for Nonviable Pregnancy Early in the First Trimester. [place] Med 2013; 956:2130-86. Electronically Signed   By: Lavonia Dana M.D.   On: 07/15/2019 20:43   US OB LESS THAN 14 WEEKS WITH OB TRANSVAGINAL  Result Date: 07/08/2019 CLINICAL DATA:  Uncertain dates. Positive urine pregnancy test. Quantitative beta HCG unknown. Estimated gestational age per LMP 9 weeks 1 day. EXAM: OBSTETRIC <14 WK [place] AND TRANSVAGINAL OB US TECHNIQUE: Both transabdominal and transvaginal ultrasound examinations were performed for complete evaluation of the gestation as well as the maternal uterus, adnexal regions, and pelvic cul-de-sac. Transvaginal technique was performed to assess early pregnancy. COMPARISON:  None. FINDINGS: Intrauterine gestational sac: Single visualized. Yolk sac:  Visualized. Embryo:  Not visualized. Cardiac Activity: Not visualized. Heart Rate: Not visualized. MSD: 11.7 mm   5 w   6 d Subchorionic hemorrhage:  None visualized. Maternal uterus/adnexae: Right ovary is within normal. Left ovary not visualized due to abundant overlying bowel gas and patient body habitus. Small amount of free pelvic fluid. IMPRESSION: 1. Intrauterine gestational sac and yolk sac visualized. No embryo or cardiac activity. Mean sac diameter compatible with estimated gestational age [redacted] weeks 6 days. Findings may be seen with normal early pregnancy versus failed pregnancy. Recommend correlation with quantitative beta HCG and follow-up serial quantitative HCG as well as follow-up ultrasound 2 weeks. Electronically Signed   By: Marin Olp M.D.   On: 07/08/2019 09:58    Assessment & Plan:   1. Miscarriage GS with yolk sac on 07/15/19, confirmation of IUP HCG 07/26/19  down to 146 No need for further follow up Declines contraception F/u Anora results   Return for annual exam/pap smear  Routine preventative health maintenance measures emphasized. Please refer to After Visit Summary for other counseling recommendations.   Return in about 3 months (around 10/31/2019) for annual.  Total face-to-face time with patient: 20 minutes. Over 50% of encounter was spent on counseling and coordination of care.  Feliz Beam, M.D. Attending Center for [employer] [facility])

## 2019-08-03 ENCOUNTER — Encounter: Payer: Self-pay | Admitting: *Deleted

## 2019-08-08 ENCOUNTER — Encounter: Payer: Self-pay | Admitting: *Deleted

## 2019-08-12 ENCOUNTER — Telehealth: Payer: Self-pay | Admitting: *Deleted

## 2019-08-12 NOTE — Telephone Encounter (Signed)
Pt informed of 2nd anora test.

## 2019-09-29 ENCOUNTER — Encounter: Payer: Self-pay | Admitting: Radiology

## 2019-12-01 DIAGNOSIS — K640 First degree hemorrhoids: Secondary | ICD-10-CM | POA: Diagnosis not present

## 2019-12-01 DIAGNOSIS — Z720 Tobacco use: Secondary | ICD-10-CM | POA: Diagnosis not present

## 2019-12-01 DIAGNOSIS — I10 Essential (primary) hypertension: Secondary | ICD-10-CM | POA: Diagnosis not present

## 2019-12-01 DIAGNOSIS — E782 Mixed hyperlipidemia: Secondary | ICD-10-CM | POA: Diagnosis not present

## 2020-02-05 ENCOUNTER — Emergency Department (HOSPITAL_BASED_OUTPATIENT_CLINIC_OR_DEPARTMENT_OTHER)
Admission: EM | Admit: 2020-02-05 | Discharge: 2020-02-05 | Disposition: A | Discharge status: Home / Self Care | Payer: Medicaid Other | Attending: Emergency Medicine | Admitting: Emergency Medicine

## 2020-02-05 ENCOUNTER — Emergency Department (HOSPITAL_BASED_OUTPATIENT_CLINIC_OR_DEPARTMENT_OTHER): Payer: Medicaid Other

## 2020-02-05 ENCOUNTER — Other Ambulatory Visit: Payer: Self-pay

## 2020-02-05 DIAGNOSIS — Z79899 Other long term (current) drug therapy: Secondary | ICD-10-CM | POA: Insufficient documentation

## 2020-02-05 DIAGNOSIS — M791 Myalgia, unspecified site: Secondary | ICD-10-CM | POA: Diagnosis not present

## 2020-02-05 DIAGNOSIS — R05 Cough: Secondary | ICD-10-CM | POA: Diagnosis present

## 2020-02-05 DIAGNOSIS — I10 Essential (primary) hypertension: Secondary | ICD-10-CM | POA: Insufficient documentation

## 2020-02-05 DIAGNOSIS — F17210 Nicotine dependence, cigarettes, uncomplicated: Secondary | ICD-10-CM | POA: Insufficient documentation

## 2020-02-05 DIAGNOSIS — J069 Acute upper respiratory infection, unspecified: Secondary | ICD-10-CM | POA: Diagnosis not present

## 2020-02-05 DIAGNOSIS — Z20822 Contact with and (suspected) exposure to covid-19: Secondary | ICD-10-CM | POA: Diagnosis not present

## 2020-02-05 DIAGNOSIS — B9789 Other viral agents as the cause of diseases classified elsewhere: Secondary | ICD-10-CM | POA: Diagnosis not present

## 2020-02-05 LAB — SARS CORONAVIRUS 2 BY RT PCR (HOSPITAL ORDER, PERFORMED IN ~~LOC~~ HOSPITAL LAB): SARS Coronavirus 2: NEGATIVE

## 2020-02-05 MED ORDER — ACETAMINOPHEN 500 MG PO TABS
1000.0000 mg | ORAL_TABLET | Freq: Once | ORAL | Status: AC
  Administered 2020-02-05: 1000 mg via ORAL
  Filled 2020-02-05: qty 2

## 2020-02-05 NOTE — ED Provider Notes (Signed)
[facility] EMERGENCY DEPARTMENT Provider Note   CSN: [id number] Arrival date & time: 02/05/20  0848     History Chief Complaint  Patient presents with   Cough    Vickie Ford is a 39 y.o. female.  The history is provided by the patient.  Cough Cough characteristics:  Non-productive Severity:  Mild Onset quality:  Gradual Timing:  Intermittent Progression:  Waxing and waning Chronicity:  New Context: upper respiratory infection   Relieved by:  Cough suppressants Worsened by:  Nothing Associated symptoms: chills, fever, myalgias and sinus congestion   Associated symptoms: no chest pain, no ear fullness, no ear pain, no eye discharge, no rash, no rhinorrhea, no shortness of breath, no sore throat and no wheezing        Past Medical History:  Diagnosis Date   Anemia    Headache    Hemorrhoid    Hypertension     There are no problems to display for this patient.   Past Surgical History:  Procedure Laterality Date   CESAREAN SECTION     COLONOSCOPY WITH PROPOFOL N/A 03/23/2019   Procedure: COLONOSCOPY WITH PROPOFOL;  Surgeon: Willis Modena, MD;  Location: [facility];  Service: Endoscopy;  Laterality: N/A;     OB History    Gravida  4   Para  2   Term  2   Preterm      AB  2   Living  2     SAB  1   TAB  1   Ectopic      Multiple      Live Births  2           No family history on file.  Social History   Tobacco Use   Smoking status: Current Every Day Smoker    Packs/day: 0.25    Years: 9.00    Pack years: 2.25    Types: Cigarettes   Smokeless tobacco: Never Used   Tobacco comment: 4-5 cigarettes per day  Vaping Use   Vaping Use: Never used  Substance Use Topics   Alcohol use: Yes    Comment: occ   Drug use: Not Currently    Types: Marijuana    Home Medications Prior to Admission medications   Medication Sig Start Date End Date Taking? Authorizing Provider  acetaminophen (TYLENOL) 500 MG  tablet Take 1,000 mg by mouth every 6 (six) hours as needed for headache (cramps).    [provider]  labetalol (NORMODYNE) 200 MG tablet Take 1 tablet (200 mg total) by mouth 2 (two) times daily. 06/29/19   Judeth Horn, NP  Prenatal Vit-Fe Fumarate-FA (PREPLUS) 27-1 MG TABS Take 1 tablet by mouth daily. 06/29/19   Judeth Horn, NP  promethazine (PHENERGAN) 25 MG tablet Take 0.5-1 tablets (12.5-25 mg total) by mouth every 6 (six) hours as needed for nausea or vomiting. 07/05/19   Donette Larry, CNM    Allergies    Penicillin g and Penicillins  Review of Systems   Review of Systems  Constitutional: Positive for chills and fever.  HENT: Negative for ear pain, rhinorrhea and sore throat.   Eyes: Negative for pain, discharge and visual disturbance.  Respiratory: Positive for cough. Negative for shortness of breath and wheezing.   Cardiovascular: Negative for chest pain and palpitations.  Gastrointestinal: Negative for abdominal pain and vomiting.  Genitourinary: Negative for dysuria and hematuria.  Musculoskeletal: Positive for myalgias. Negative for arthralgias and back pain.  Skin: Negative for color change  and rash.  Neurological: Negative for seizures and syncope.  All other systems reviewed and are negative.   Physical Exam Updated Vital Signs  ED Triage Vitals  Enc Vitals Group     BP 02/05/20 0910 139/81     Pulse Rate 02/05/20 0910 83     Resp 02/05/20 0910 18     Temp 02/05/20 0910 98.6 F (37 C)     Temp Source 02/05/20 0910 Oral     SpO2 02/05/20 0910 99 %     Weight --      Height --      Head Circumference --      Peak Flow --      Pain Score 02/05/20 0902 6     Pain Loc --      Pain Edu? --      Excl. in GC? --     Physical Exam Vitals and nursing note reviewed.  Constitutional:      General: She is not in acute distress.    Appearance: She is well-developed. She is not ill-appearing.  HENT:     Head: Normocephalic and atraumatic.      Nose: Nose normal.     Mouth/Throat:     Mouth: Mucous membranes are moist.  Eyes:     Extraocular Movements: Extraocular movements intact.     Conjunctiva/sclera: Conjunctivae normal.     Pupils: Pupils are equal, round, and reactive to light.  Cardiovascular:     Rate and Rhythm: Normal rate and regular rhythm.     Pulses: Normal pulses.     Heart sounds: Normal heart sounds. No murmur heard.   Pulmonary:     Effort: Pulmonary effort is normal. No respiratory distress.     Breath sounds: Normal breath sounds.  Abdominal:     Palpations: Abdomen is soft.     Tenderness: There is no abdominal tenderness.  Musculoskeletal:     Cervical back: Neck supple.  Skin:    General: Skin is warm and dry.     Capillary Refill: Capillary refill takes less than 2 seconds.  Neurological:     General: No focal deficit present.     Mental Status: She is alert.     ED Results / Procedures / Treatments   Labs (all labs ordered are listed, but only abnormal results are displayed) Labs Reviewed  SARS CORONAVIRUS 2 BY RT PCR (HOSPITAL ORDER, PERFORMED IN [facility])    EKG None  Radiology DG Chest Portable 1 View  Result Date: 02/05/2020 CLINICAL DATA:  Cough, congestion, headache and fever for 2 days. EXAM: PORTABLE CHEST 1 VIEW COMPARISON:  PA and lateral chest 04/02/2017. FINDINGS: The exam is limited by the patient's habitus and portable technique. Lungs clear. Heart size normal. No pneumothorax or pleural fluid. No bony abnormality. IMPRESSION: Negative chest. Electronically Signed   By: Drusilla Kanner M.D.   On: 02/05/2020 10:17    Procedures Procedures (including critical care time)  Medications Ordered in ED Medications  acetaminophen (TYLENOL) tablet 1,000 mg (1,000 mg Oral Given 02/05/20 0950)    ED Course  I have reviewed the triage vital signs and the nursing notes.  Pertinent labs & imaging results that were available during my care of the patient were  reviewed by me and considered in my medical decision making (see chart for details).    MDM Rules/Calculators/A&P  Jerrie Schussler Macbeth is a 39 year old female with history of hypertension who presents to the ED with URI symptoms.  Not vaccinated against coronavirus.  Clear breath sounds.  Overall well-appearing.  Normal vitals.  No fever.  Will get chest x-ray.  Will test for Covid.  Given Tylenol for pain.  Likely viral process.  No concern for asthma or other reactive airway process.  Patient with no shortness of breath.  Mostly cough and congestion.  Doubt PE or cardiac process.  Chest x-ray without any signs of infection.  Symptomatic control at home.  Self-isolation until Covid test is back.  Discharged in good condition.  This chart was dictated using voice recognition software.  Despite best efforts to proofread,  errors can occur which can change the documentation meaning.   Vickie Ford was evaluated in Emergency Department on 02/05/2020 for the symptoms described in the history of present illness. She was evaluated in the context of the global COVID-19 pandemic, which necessitated consideration that the patient might be at risk for infection with the SARS-CoV-2 virus that causes COVID-19. Institutional protocols and algorithms that pertain to the evaluation of patients at risk for COVID-19 are in a state of rapid change based on information released by regulatory bodies including the CDC and federal and state organizations. These policies and algorithms were followed during the patient's care in the ED.  Final Clinical Impression(s) / ED Diagnoses Final diagnoses:  Viral URI with cough    Rx / DC Orders ED Discharge Orders    None       Virgina Norfolk, DO 02/05/20 1021

## 2020-02-05 NOTE — ED Triage Notes (Signed)
Pt states cough congestion headache, body aches for past few days, also reports pain left rib/chest.  Taking mucinex, helping slightly with symptoms.  Took tylenol last yesterday

## 2020-02-05 NOTE — Discharge Instructions (Addendum)
Your chest x-ray today is normal.  Continue Tylenol, Motrin, over-the-counter medications.  Isolate until you hear back about your coronavirus test.

## 2020-02-07 DIAGNOSIS — E782 Mixed hyperlipidemia: Secondary | ICD-10-CM | POA: Diagnosis not present

## 2020-07-27 DIAGNOSIS — I10 Essential (primary) hypertension: Secondary | ICD-10-CM | POA: Diagnosis not present

## 2020-07-27 DIAGNOSIS — E782 Mixed hyperlipidemia: Secondary | ICD-10-CM | POA: Diagnosis not present

## 2020-07-27 DIAGNOSIS — Z131 Encounter for screening for diabetes mellitus: Secondary | ICD-10-CM | POA: Diagnosis not present

## 2020-07-27 DIAGNOSIS — Z720 Tobacco use: Secondary | ICD-10-CM | POA: Diagnosis not present

## 2020-07-27 DIAGNOSIS — K640 First degree hemorrhoids: Secondary | ICD-10-CM | POA: Diagnosis not present

## 2020-07-27 DIAGNOSIS — Z9189 Other specified personal risk factors, not elsewhere classified: Secondary | ICD-10-CM | POA: Diagnosis not present

## 2020-07-27 DIAGNOSIS — N760 Acute vaginitis: Secondary | ICD-10-CM | POA: Diagnosis not present

## 2020-09-27 DIAGNOSIS — E782 Mixed hyperlipidemia: Secondary | ICD-10-CM | POA: Diagnosis not present

## 2020-09-27 DIAGNOSIS — H1011 Acute atopic conjunctivitis, right eye: Secondary | ICD-10-CM | POA: Diagnosis not present

## 2020-09-27 DIAGNOSIS — I10 Essential (primary) hypertension: Secondary | ICD-10-CM | POA: Diagnosis not present

## 2020-09-27 DIAGNOSIS — K640 First degree hemorrhoids: Secondary | ICD-10-CM | POA: Diagnosis not present

## 2020-09-27 DIAGNOSIS — Z9189 Other specified personal risk factors, not elsewhere classified: Secondary | ICD-10-CM | POA: Diagnosis not present

## 2020-09-27 DIAGNOSIS — Z720 Tobacco use: Secondary | ICD-10-CM | POA: Diagnosis not present

## 2021-01-02 ENCOUNTER — Encounter (HOSPITAL_BASED_OUTPATIENT_CLINIC_OR_DEPARTMENT_OTHER): Payer: Self-pay

## 2021-01-02 ENCOUNTER — Emergency Department (HOSPITAL_BASED_OUTPATIENT_CLINIC_OR_DEPARTMENT_OTHER): Payer: Medicaid Other

## 2021-01-02 ENCOUNTER — Other Ambulatory Visit: Payer: Self-pay

## 2021-01-02 ENCOUNTER — Emergency Department (HOSPITAL_BASED_OUTPATIENT_CLINIC_OR_DEPARTMENT_OTHER)
Admission: EM | Admit: 2021-01-02 | Discharge: 2021-01-02 | Disposition: A | Discharge status: Home / Self Care | Payer: Medicaid Other | Attending: Emergency Medicine | Admitting: Emergency Medicine

## 2021-01-02 DIAGNOSIS — S99911A Unspecified injury of right ankle, initial encounter: Secondary | ICD-10-CM | POA: Diagnosis present

## 2021-01-02 DIAGNOSIS — Z79899 Other long term (current) drug therapy: Secondary | ICD-10-CM | POA: Insufficient documentation

## 2021-01-02 DIAGNOSIS — I10 Essential (primary) hypertension: Secondary | ICD-10-CM | POA: Insufficient documentation

## 2021-01-02 DIAGNOSIS — S93401A Sprain of unspecified ligament of right ankle, initial encounter: Secondary | ICD-10-CM | POA: Diagnosis not present

## 2021-01-02 DIAGNOSIS — W228XXA Striking against or struck by other objects, initial encounter: Secondary | ICD-10-CM | POA: Diagnosis not present

## 2021-01-02 DIAGNOSIS — M25571 Pain in right ankle and joints of right foot: Secondary | ICD-10-CM | POA: Diagnosis not present

## 2021-01-02 DIAGNOSIS — F17210 Nicotine dependence, cigarettes, uncomplicated: Secondary | ICD-10-CM | POA: Diagnosis not present

## 2021-01-02 NOTE — Discharge Instructions (Addendum)
X-ray negative for fracture.  Overall suspect you have an ankle sprain.  Recommend 400 mg ibuprofen every 8 hours as needed for pain.  Recommend 1000 mg of Tylenol every 6 hours as needed for pain.  Recommend ice.  Activity as tolerated while wearing boot.  Follow-up with your primary care doctor.

## 2021-01-02 NOTE — ED Triage Notes (Signed)
Pt states she hit right foot on a curb ~4 days PTA-NAD-limping gait

## 2021-01-02 NOTE — ED Provider Notes (Signed)
[facility] EMERGENCY DEPARTMENT Provider Note   CSN: [id number] Arrival date & time: 01/02/21  1208     History Chief Complaint  Patient presents with   Foot Injury    Vickie Ford is a 40 y.o. female.  Patient rolled her right ankle while stepping off of a curb several days ago.  Having pain with ambulation.     Foot Injury Location:  Ankle Time since incident:  4 days Injury: yes   Ankle location:  R ankle Pain details:    Quality:  Aching   Severity:  Mild   Onset quality:  Gradual   Timing:  Intermittent   Progression:  Waxing and waning Chronicity:  New Relieved by:  Nothing Worsened by:  Bearing weight Associated symptoms: no back pain, no decreased ROM, no fatigue, no fever, no itching, no muscle weakness, no neck pain, no numbness, no stiffness, no swelling and no tingling       Past Medical History:  Diagnosis Date   Anemia    Headache    Hemorrhoid    Hypertension     There are no problems to display for this patient.   Past Surgical History:  Procedure Laterality Date   CESAREAN SECTION     COLONOSCOPY WITH PROPOFOL N/A 03/23/2019   Procedure: COLONOSCOPY WITH PROPOFOL;  Surgeon: Willis Modena, MD;  Location: [facility];  Service: Endoscopy;  Laterality: N/A;     OB History     Gravida  4   Para  2   Term  2   Preterm      AB  2   Living  2      SAB  1   IAB  1   Ectopic      Multiple      Live Births  2           No family history on file.  Social History   Tobacco Use   Smoking status: Every Day    Packs/day: 0.25    Years: 9.00    Pack years: 2.25    Types: Cigarettes   Smokeless tobacco: Never  Vaping Use   Vaping Use: Never used  Substance Use Topics   Alcohol use: Yes    Comment: occ   Drug use: Not Currently    Types: Marijuana    Home Medications Prior to Admission medications   Medication Sig Start Date End Date Taking? Authorizing Provider  acetaminophen (TYLENOL) 500  MG tablet Take 1,000 mg by mouth every 6 (six) hours as needed for headache (cramps).    [provider]  labetalol (NORMODYNE) 200 MG tablet Take 1 tablet (200 mg total) by mouth 2 (two) times daily. 06/29/19   Judeth Horn, NP  Prenatal Vit-Fe Fumarate-FA (PREPLUS) 27-1 MG TABS Take 1 tablet by mouth daily. 06/29/19   Judeth Horn, NP  promethazine (PHENERGAN) 25 MG tablet Take 0.5-1 tablets (12.5-25 mg total) by mouth every 6 (six) hours as needed for nausea or vomiting. 07/05/19   Donette Larry, CNM    Allergies    Penicillin g and Penicillins  Review of Systems   Review of Systems  Constitutional:  Negative for chills, fatigue and fever.  HENT:  Negative for ear pain and sore throat.   Eyes:  Negative for pain and visual disturbance.  Respiratory:  Negative for cough and shortness of breath.   Cardiovascular:  Negative for chest pain and palpitations.  Gastrointestinal:  Negative for abdominal pain and vomiting.  Genitourinary:  Negative for dysuria and hematuria.  Musculoskeletal:  Positive for arthralgias and gait problem. Negative for back pain, joint swelling, myalgias, neck pain, neck stiffness and stiffness.  Skin:  Negative for color change, itching, pallor, rash and wound.  Neurological:  Negative for seizures, syncope, weakness and numbness.  All other systems reviewed and are negative.  Physical Exam Updated Vital Signs  ED Triage Vitals  Enc Vitals Group     BP 01/02/21 1213 (!) 141/90     Pulse Rate 01/02/21 1213 90     Resp 01/02/21 1213 18     Temp 01/02/21 1213 98.6 F (37 C)     Temp Source 01/02/21 1213 Oral     SpO2 01/02/21 1213 100 %     Weight 01/02/21 1215 (!) 370 lb (167.8 kg)     Height 01/02/21 1215 5\' 7"  (1.702 m)     Head Circumference --      Peak Flow --      Pain Score 01/02/21 1212 7     Pain Loc --      Pain Edu? --      Excl. in GC? --      Physical Exam Constitutional:      General: She is not in acute distress.     Appearance: She is not ill-appearing.  Cardiovascular:     Pulses: Normal pulses.  Musculoskeletal:        General: Tenderness present. No swelling. Normal range of motion.     Comments: Tenderness to medial portion of the right ankle with no major swelling and good range of motion  Skin:    General: Skin is warm.     Capillary Refill: Capillary refill takes less than 2 seconds.  Neurological:     General: No focal deficit present.     Mental Status: She is alert.     Sensory: No sensory deficit.     Motor: No weakness.    ED Results / Procedures / Treatments   Labs (all labs ordered are listed, but only abnormal results are displayed) Labs Reviewed - No data to display  EKG None  Radiology DG Ankle Complete Right  Result Date: 01/02/2021 CLINICAL DATA:  Medial right ankle pain after injury 4 days ago EXAM: RIGHT ANKLE - COMPLETE 3+ VIEW COMPARISON:  None. FINDINGS: There is no evidence of fracture, dislocation, or joint effusion. There is no evidence of arthropathy or other focal bone abnormality. Soft tissues are unremarkable. IMPRESSION: Negative. Electronically Signed   By: 01/04/2021 D.O.   On: 01/02/2021 12:50    Procedures Procedures   Medications Ordered in ED Medications - No data to display  ED Course  I have reviewed the triage vital signs and the nursing notes.  Pertinent labs & imaging results that were available during my care of the patient were reviewed by me and considered in my medical decision making (see chart for details).    MDM Rules/Calculators/A&P                          Marshal L Coba is here with right ankle pain.  Patient stepped off of a curb finding several days ago and has been having pain in the right ankle since.  Tender to the medial portion of the right ankle.  No major swelling, good pulses, good range of motion.  Overall suspect ankle sprain.  Will get x-ray.  X-ray negative for fracture.  Will  place in a walking boot for  support as she likely would not do very well with crutches.  Recommend ice, Tylenol, Motrin, follow-up with primary care doctor.  Overall suspect ankle sprain.  This chart was dictated using voice recognition software.  Despite best efforts to proofread,  errors can occur which can change the documentation meaning.   Final Clinical Impression(s) / ED Diagnoses Final diagnoses:  Sprain of right ankle, unspecified ligament, initial encounter    Rx / DC Orders ED Discharge Orders     None        Virgina Norfolk, DO 01/02/21 1255

## 2021-03-11 DIAGNOSIS — Z0000 Encounter for general adult medical examination without abnormal findings: Secondary | ICD-10-CM | POA: Diagnosis not present

## 2021-03-11 DIAGNOSIS — K640 First degree hemorrhoids: Secondary | ICD-10-CM | POA: Diagnosis not present

## 2021-03-11 DIAGNOSIS — E782 Mixed hyperlipidemia: Secondary | ICD-10-CM | POA: Diagnosis not present

## 2021-03-11 DIAGNOSIS — Z131 Encounter for screening for diabetes mellitus: Secondary | ICD-10-CM | POA: Diagnosis not present

## 2021-03-11 DIAGNOSIS — I10 Essential (primary) hypertension: Secondary | ICD-10-CM | POA: Diagnosis not present

## 2021-03-11 DIAGNOSIS — Z720 Tobacco use: Secondary | ICD-10-CM | POA: Diagnosis not present

## 2021-03-11 DIAGNOSIS — Z9189 Other specified personal risk factors, not elsewhere classified: Secondary | ICD-10-CM | POA: Diagnosis not present

## 2021-03-24 DIAGNOSIS — E8881 Metabolic syndrome: Secondary | ICD-10-CM | POA: Diagnosis not present

## 2021-03-24 DIAGNOSIS — E559 Vitamin D deficiency, unspecified: Secondary | ICD-10-CM | POA: Diagnosis not present

## 2021-03-24 DIAGNOSIS — E7800 Pure hypercholesterolemia, unspecified: Secondary | ICD-10-CM | POA: Diagnosis not present

## 2021-03-24 DIAGNOSIS — R635 Abnormal weight gain: Secondary | ICD-10-CM | POA: Diagnosis not present

## 2021-03-24 DIAGNOSIS — N926 Irregular menstruation, unspecified: Secondary | ICD-10-CM | POA: Diagnosis not present

## 2021-03-24 DIAGNOSIS — R0602 Shortness of breath: Secondary | ICD-10-CM | POA: Diagnosis not present

## 2021-03-24 DIAGNOSIS — Z79899 Other long term (current) drug therapy: Secondary | ICD-10-CM | POA: Diagnosis not present

## 2021-03-24 DIAGNOSIS — R5381 Other malaise: Secondary | ICD-10-CM | POA: Diagnosis not present

## 2021-06-08 DIAGNOSIS — Z131 Encounter for screening for diabetes mellitus: Secondary | ICD-10-CM | POA: Diagnosis not present

## 2021-06-08 DIAGNOSIS — E559 Vitamin D deficiency, unspecified: Secondary | ICD-10-CM | POA: Diagnosis not present

## 2021-06-08 DIAGNOSIS — Z79899 Other long term (current) drug therapy: Secondary | ICD-10-CM | POA: Diagnosis not present

## 2021-06-08 DIAGNOSIS — E8881 Metabolic syndrome: Secondary | ICD-10-CM | POA: Diagnosis not present

## 2021-07-17 IMAGING — US US OB < 14 WEEKS - US OB TV
1 series · 15 of 28 positions shown · non-contrast
Comparison: None.

CLINICAL DATA: Uncertain dates. Positive urine pregnancy test.
Quantitative beta HCG unknown. Estimated gestational age per LMP 9
weeks 1 day.

EXAM:
OBSTETRIC <14 WK US AND TRANSVAGINAL OB US
TECHNIQUE: Both transabdominal and transvaginal ultrasound examinations were
performed for complete evaluation of the gestation as well as the
maternal uterus, adnexal regions, and pelvic cul-de-sac.
Transvaginal technique was performed to assess early pregnancy.

[Series 1: us ob < 14 weeks - us ob tv · 15 of 74 slices shown]
[im 1/74]
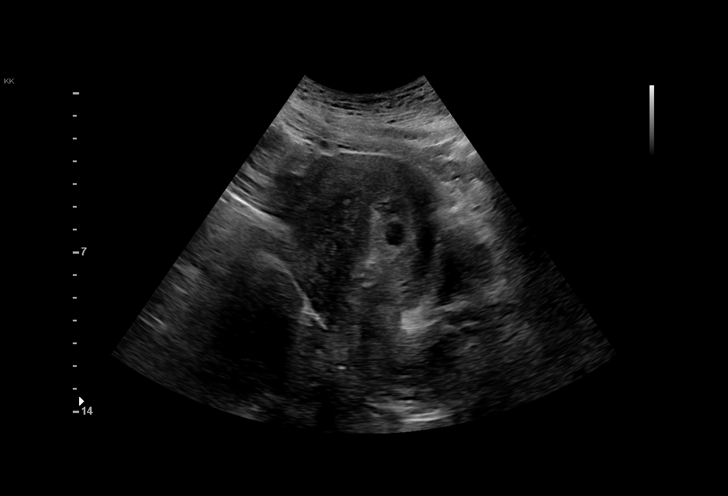
[im 6/74]
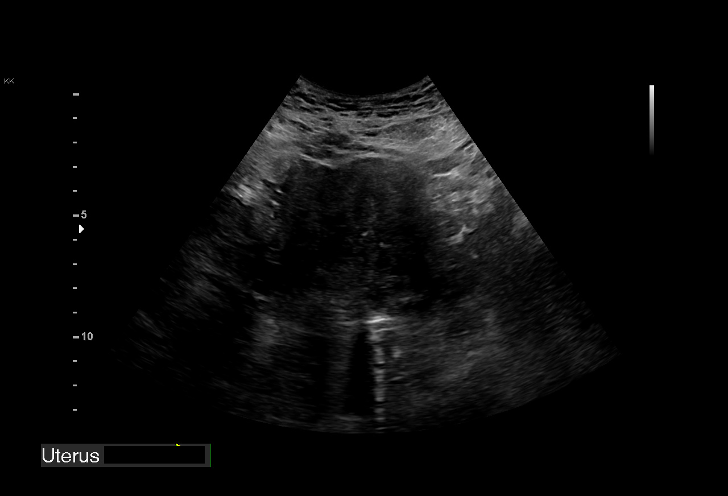
[im 11/74]
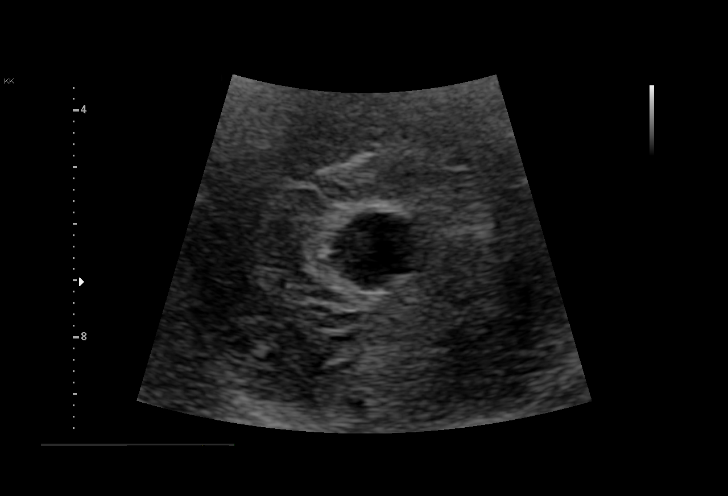
[im 17/74]
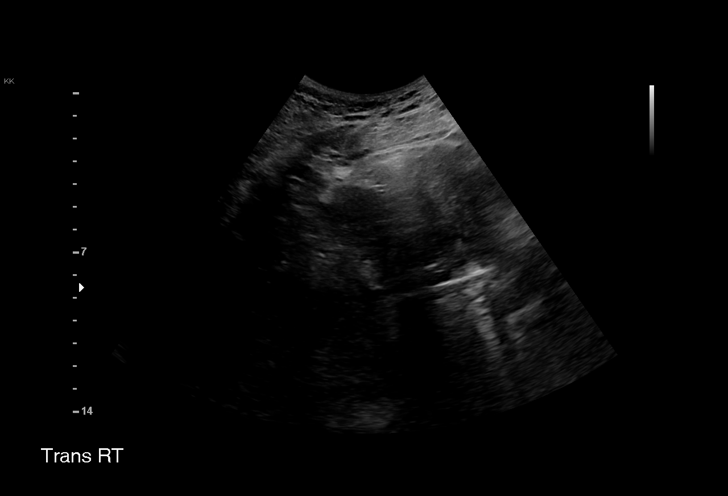
[im 22/74]
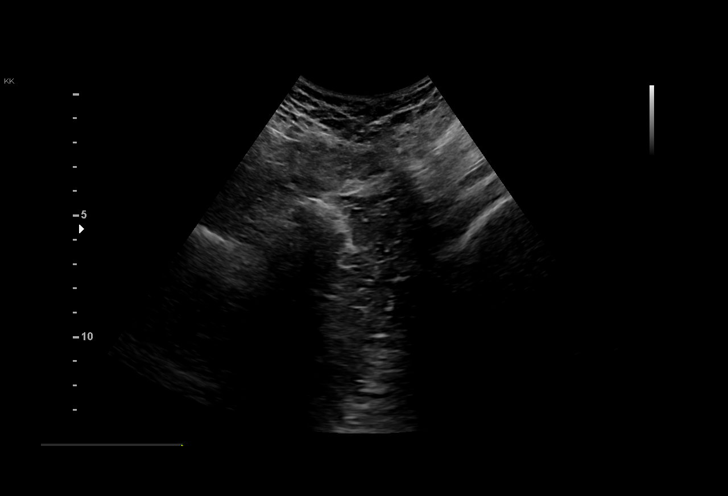
[im 28/74]
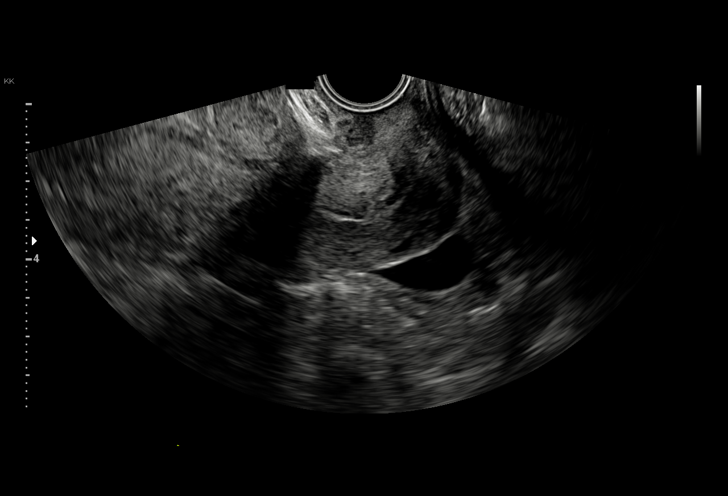
[im 33/74]
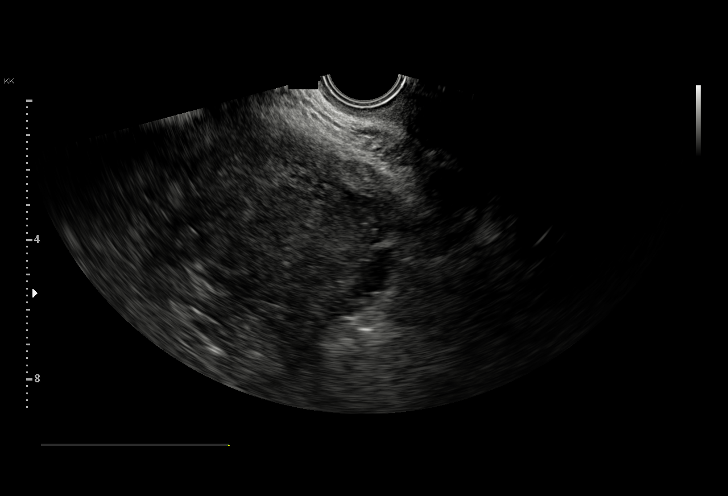
[im 38/74]
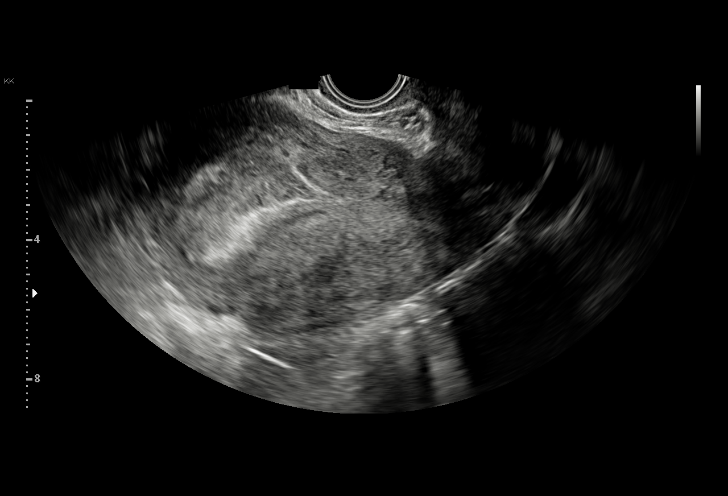
[im 41/74]
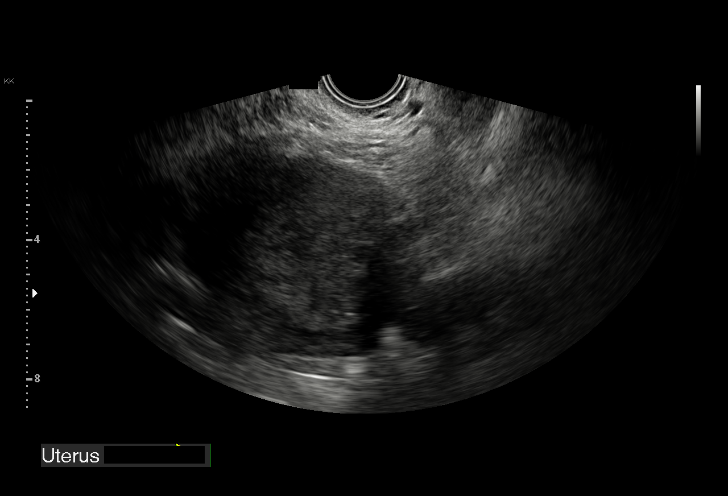
[im 46/74]
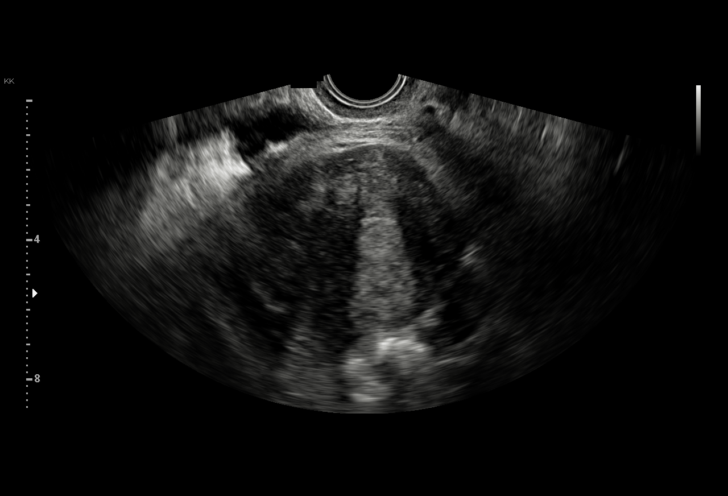
[im 52/74]
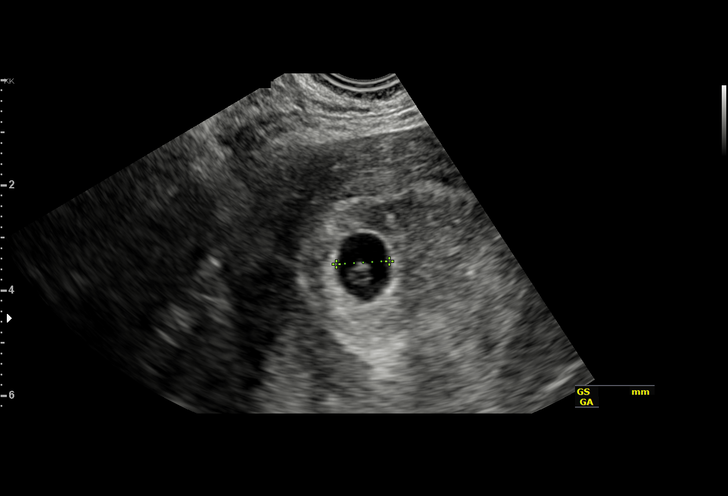
[im 57/74]
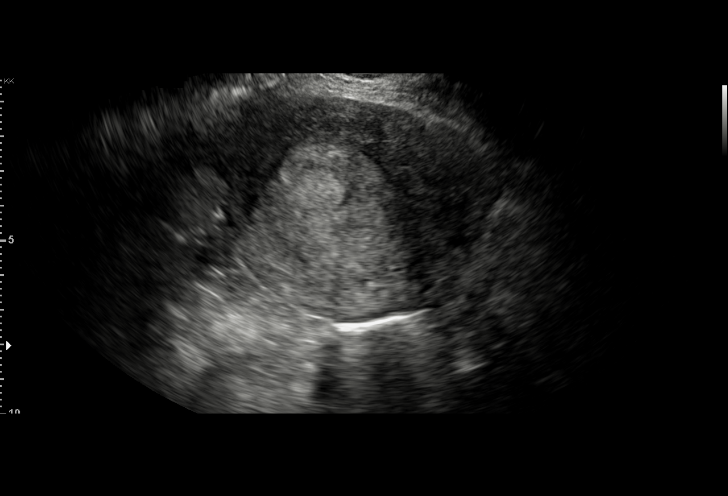
[im 63/74]
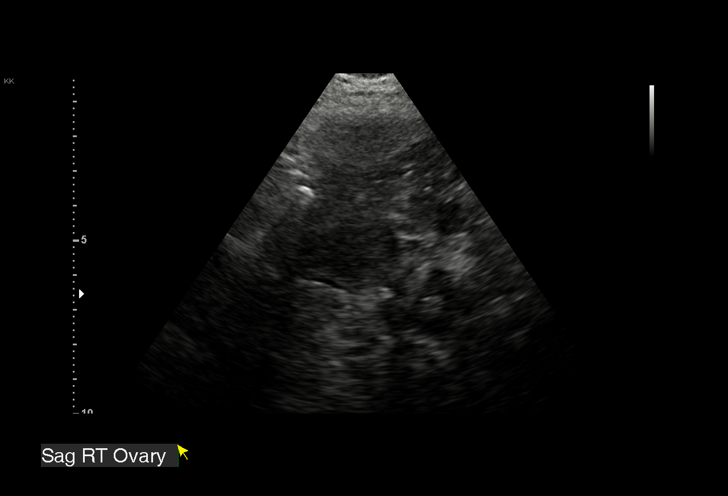
[im 68/74]
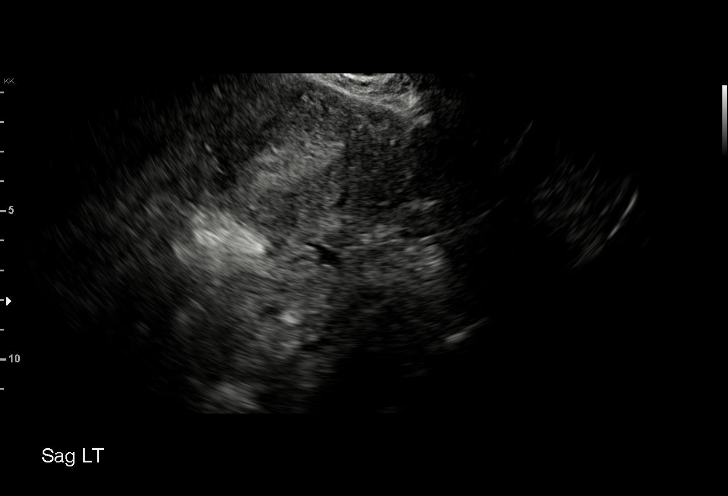
[im 74/74]
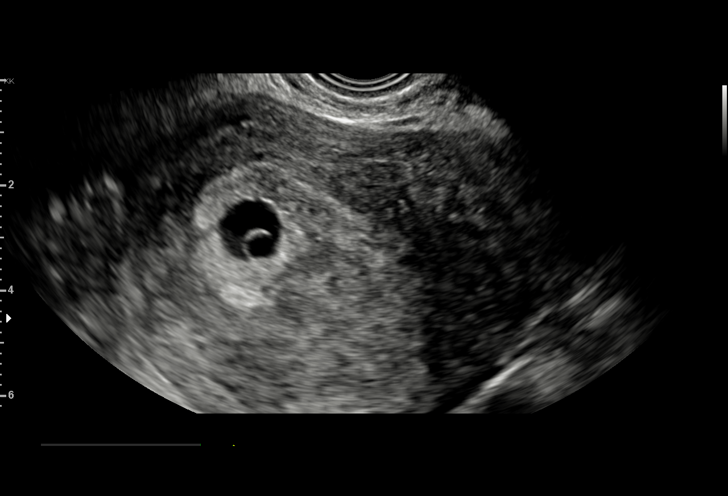

[15 of 28 positions shown; findings below may reference images not displayed]

FINDINGS: Intrauterine gestational sac: Single visualized.

Yolk sac:  Visualized.

Embryo:  Not visualized.

Cardiac Activity: Not visualized.

Heart Rate: Not visualized.

MSD: 11.7 mm   5 w   6 d

Subchorionic hemorrhage:  None visualized.

Maternal uterus/adnexae: Right ovary is within normal. Left ovary
not visualized due to abundant overlying bowel gas and patient body
habitus. Small amount of free pelvic fluid.
IMPRESSION: 1. Intrauterine gestational sac and yolk sac visualized. No embryo
or cardiac activity. Mean sac diameter compatible with estimated
gestational age 5 weeks 6 days. Findings may be seen with normal
early pregnancy versus failed pregnancy. Recommend correlation with
quantitative beta HCG and follow-up serial quantitative HCG as well
as follow-up ultrasound 2 weeks.

## 2021-07-26 DIAGNOSIS — N898 Other specified noninflammatory disorders of vagina: Secondary | ICD-10-CM | POA: Diagnosis not present

## 2021-07-26 DIAGNOSIS — R072 Precordial pain: Secondary | ICD-10-CM | POA: Diagnosis not present

## 2021-08-02 DIAGNOSIS — M129 Arthropathy, unspecified: Secondary | ICD-10-CM | POA: Diagnosis not present

## 2021-08-08 DIAGNOSIS — E8881 Metabolic syndrome: Secondary | ICD-10-CM | POA: Diagnosis not present

## 2021-08-08 DIAGNOSIS — R635 Abnormal weight gain: Secondary | ICD-10-CM | POA: Diagnosis not present

## 2021-08-08 DIAGNOSIS — R9431 Abnormal electrocardiogram [ECG] [EKG]: Secondary | ICD-10-CM | POA: Diagnosis not present

## 2021-08-08 DIAGNOSIS — F5081 Binge eating disorder: Secondary | ICD-10-CM | POA: Diagnosis not present

## 2021-08-13 DIAGNOSIS — Z79899 Other long term (current) drug therapy: Secondary | ICD-10-CM | POA: Diagnosis not present

## 2021-08-13 DIAGNOSIS — E559 Vitamin D deficiency, unspecified: Secondary | ICD-10-CM | POA: Diagnosis not present

## 2021-08-13 DIAGNOSIS — E7800 Pure hypercholesterolemia, unspecified: Secondary | ICD-10-CM | POA: Diagnosis not present

## 2021-08-20 DIAGNOSIS — Z79899 Other long term (current) drug therapy: Secondary | ICD-10-CM | POA: Diagnosis not present

## 2021-09-30 ENCOUNTER — Other Ambulatory Visit: Payer: Self-pay

## 2021-09-30 ENCOUNTER — Emergency Department (HOSPITAL_COMMUNITY)
Admission: EM | Admit: 2021-09-30 | Discharge: 2021-09-30 | Disposition: A | Discharge status: Home / Self Care | Payer: BC Managed Care – PPO | Attending: Emergency Medicine | Admitting: Emergency Medicine

## 2021-09-30 ENCOUNTER — Encounter (HOSPITAL_COMMUNITY): Payer: Self-pay | Admitting: *Deleted

## 2021-09-30 ENCOUNTER — Emergency Department (HOSPITAL_COMMUNITY): Payer: BC Managed Care – PPO

## 2021-09-30 DIAGNOSIS — E662 Morbid (severe) obesity with alveolar hypoventilation: Secondary | ICD-10-CM | POA: Diagnosis not present

## 2021-09-30 DIAGNOSIS — K529 Noninfective gastroenteritis and colitis, unspecified: Secondary | ICD-10-CM | POA: Insufficient documentation

## 2021-09-30 DIAGNOSIS — I10 Essential (primary) hypertension: Secondary | ICD-10-CM | POA: Diagnosis not present

## 2021-09-30 DIAGNOSIS — Z6841 Body Mass Index (BMI) 40.0 and over, adult: Secondary | ICD-10-CM | POA: Diagnosis not present

## 2021-09-30 DIAGNOSIS — R1110 Vomiting, unspecified: Secondary | ICD-10-CM | POA: Diagnosis not present

## 2021-09-30 DIAGNOSIS — R109 Unspecified abdominal pain: Secondary | ICD-10-CM | POA: Diagnosis not present

## 2021-09-30 DIAGNOSIS — R112 Nausea with vomiting, unspecified: Secondary | ICD-10-CM | POA: Diagnosis present

## 2021-09-30 DIAGNOSIS — Z20822 Contact with and (suspected) exposure to covid-19: Secondary | ICD-10-CM | POA: Insufficient documentation

## 2021-09-30 DIAGNOSIS — Z79899 Other long term (current) drug therapy: Secondary | ICD-10-CM | POA: Diagnosis not present

## 2021-09-30 DIAGNOSIS — L905 Scar conditions and fibrosis of skin: Secondary | ICD-10-CM | POA: Diagnosis not present

## 2021-09-30 LAB — RESP PANEL BY RT-PCR (FLU A&B, COVID) ARPGX2
Influenza A by PCR: NEGATIVE
Influenza B by PCR: NEGATIVE
SARS Coronavirus 2 by RT PCR: NEGATIVE

## 2021-09-30 LAB — URINALYSIS, ROUTINE W REFLEX MICROSCOPIC
Bacteria, UA: NONE SEEN
Bilirubin Urine: NEGATIVE
Glucose, UA: NEGATIVE mg/dL
Hgb urine dipstick: NEGATIVE
Ketones, ur: 5 mg/dL — AB
Leukocytes,Ua: NEGATIVE
Nitrite: NEGATIVE
Protein, ur: NEGATIVE mg/dL
Specific Gravity, Urine: 1.032 — ABNORMAL HIGH (ref 1.005–1.030)
pH: 7 (ref 5.0–8.0)

## 2021-09-30 LAB — CBC
HCT: 38.2 % (ref 36.0–46.0)
Hemoglobin: 12.9 g/dL (ref 12.0–15.0)
MCH: 30.1 pg (ref 26.0–34.0)
MCHC: 33.8 g/dL (ref 30.0–36.0)
MCV: 89 fL (ref 80.0–100.0)
Platelets: 306 10*3/uL (ref 150–400)
RBC: 4.29 MIL/uL (ref 3.87–5.11)
RDW: 13.2 % (ref 11.5–15.5)
WBC: 7.3 10*3/uL (ref 4.0–10.5)
nRBC: 0 % (ref 0.0–0.2)

## 2021-09-30 LAB — COMPREHENSIVE METABOLIC PANEL
ALT: 17 U/L (ref 0–44)
AST: 17 U/L (ref 15–41)
Albumin: 4 g/dL (ref 3.5–5.0)
Alkaline Phosphatase: 56 U/L (ref 38–126)
Anion gap: 8 (ref 5–15)
BUN: 8 mg/dL (ref 6–20)
CO2: 23 mmol/L (ref 22–32)
Calcium: 8.7 mg/dL — ABNORMAL LOW (ref 8.9–10.3)
Chloride: 105 mmol/L (ref 98–111)
Creatinine, Ser: 0.68 mg/dL (ref 0.44–1.00)
GFR, Estimated: >60 mL/min (ref >60)
Glucose, Bld: 124 mg/dL — ABNORMAL HIGH (ref 70–99)
Potassium: 4.1 mmol/L (ref 3.5–5.1)
Sodium: 136 mmol/L (ref 135–145)
Total Bilirubin: 0.4 mg/dL (ref 0.3–1.2)
Total Protein: 7.4 g/dL (ref 6.5–8.1)

## 2021-09-30 LAB — I-STAT BETA HCG BLOOD, ED (MC, WL, AP ONLY): I-stat hCG, quantitative: <5 m[IU]/mL (ref <5)

## 2021-09-30 LAB — LIPASE, BLOOD: Lipase: 35 U/L (ref 11–51)

## 2021-09-30 MED ORDER — ONDANSETRON HCL 4 MG/2ML IJ SOLN
4.0000 mg | Freq: Once | INTRAMUSCULAR | Status: AC
  Administered 2021-09-30: 4 mg via INTRAVENOUS
  Filled 2021-09-30: qty 2

## 2021-09-30 MED ORDER — ONDANSETRON 4 MG PO TBDP: 4.0000 mg | ORAL_TABLET | Freq: Three times a day (TID) | ORAL | 0 refills | Status: DC | PRN

## 2021-09-30 MED ORDER — IOHEXOL 300 MG/ML  SOLN
100.0000 mL | Freq: Once | INTRAMUSCULAR | Status: AC | PRN
  Administered 2021-09-30: 100 mL via INTRAVENOUS

## 2021-09-30 MED ORDER — DICYCLOMINE HCL 20 MG PO TABS: 20.0000 mg | ORAL_TABLET | Freq: Two times a day (BID) | ORAL | 0 refills | Status: DC

## 2021-09-30 MED ORDER — DICYCLOMINE HCL 10 MG/ML IM SOLN
20.0000 mg | Freq: Once | INTRAMUSCULAR | Status: AC
  Administered 2021-09-30: 20 mg via INTRAMUSCULAR
  Filled 2021-09-30: qty 2

## 2021-09-30 MED ORDER — MORPHINE SULFATE (PF) 4 MG/ML IV SOLN
4.0000 mg | Freq: Once | INTRAVENOUS | Status: AC
  Administered 2021-09-30: 4 mg via INTRAVENOUS
  Filled 2021-09-30: qty 1

## 2021-09-30 MED ORDER — SODIUM CHLORIDE 0.9 % IV BOLUS
1000.0000 mL | Freq: Once | INTRAVENOUS | Status: AC
  Administered 2021-09-30: 1000 mL via INTRAVENOUS

## 2021-09-30 NOTE — ED Provider Notes (Incomplete)
[facility] DEPT Provider Note   CSN: [id number] Arrival date & time: 09/30/21  1322     History {Add pertinent medical, surgical, social history, OB history to HPI:1} Chief Complaint  Patient presents with   Abdominal Pain    Vickie Ford is a 41 y.o. female with past medical history of hypertension, anemia, cesarean section.  Presents to the emergency department with a chief complaint of nausea, vomiting, diarrhea, and abdominal pain.  Patient reports that her symptoms started last night.  Since then patient has vomited 3-4 times.  Patient describes emesis as stomach contents.  Denies any hematemesis or coffee-ground emesis.  Patient reports that she has had 3-4 bouts of diarrhea.  Patient denies any blood in stool or melena.  Patient describes abdominal pain as "contractions."  Pain comes on randomly and resolves without intervention.  Denies any aggravating factors.  Patient reports that last menstrual period was "sometime last month."  Patient is sexually active in a mutually monogamous relationship with a female partner.  Patient endorses occasional alcohol use.  Last had alcohol on Friday.  Patient endorses regular marijuana use.  Patient denies any fever, chills, abdominal distention, abdominal bleeding, blood in stool, constipation, rectal pain, dysuria, hematuria, urinary urgency, vaginal pain, vaginal bleeding, vaginal discharge, genital sores or lesions   Abdominal Pain Associated symptoms: diarrhea, nausea and vomiting   Associated symptoms: no chest pain, no chills, no constipation, no dysuria, no fever, no hematuria, no shortness of breath, no vaginal bleeding and no vaginal discharge       Home Medications Prior to Admission medications   Medication Sig Start Date End Date Taking? Authorizing Provider  acetaminophen (TYLENOL) 500 MG tablet Take 1,000 mg by mouth every 6 (six) hours as needed for headache (cramps).    [provider]  labetalol (NORMODYNE) 200 MG tablet Take 1 tablet (200 mg total) by mouth 2 (two) times daily. 06/29/19   Jorje Guild, NP  Prenatal Vit-Fe Fumarate-FA (PREPLUS) 27-1 MG TABS Take 1 tablet by mouth daily. 06/29/19   Jorje Guild, NP  promethazine (PHENERGAN) 25 MG tablet Take 0.5-1 tablets (12.5-25 mg total) by mouth every 6 (six) hours as needed for nausea or vomiting. 07/05/19   Julianne Handler, CNM      Allergies    Penicillin g and Penicillins    Review of Systems   Review of Systems  Constitutional:  Negative for chills and fever.  Eyes:  Negative for visual disturbance.  Respiratory:  Negative for shortness of breath.   Cardiovascular:  Negative for chest pain.  Gastrointestinal:  Positive for abdominal pain, diarrhea, nausea and vomiting. Negative for abdominal distention, anal bleeding, blood in stool, constipation and rectal pain.  Genitourinary:  Negative for difficulty urinating, dysuria, genital sores, hematuria, pelvic pain, urgency, vaginal bleeding, vaginal discharge and vaginal pain.  Musculoskeletal:  Negative for back pain and neck pain.  Skin:  Negative for color change and rash.  Neurological:  Negative for dizziness, syncope, light-headedness and headaches.  Psychiatric/Behavioral:  Negative for confusion.    Physical Exam Updated Vital Signs BP (!) 149/96    Pulse 85    Temp 98.2 F (36.8 C) (Oral)    Resp 20    Ht '5\' 8"'  (1.727 m)    Wt (!) 168.6 kg    SpO2 100%    BMI 56.52 kg/m  Physical Exam Vitals and nursing note reviewed.  Constitutional:      General: She is not in acute distress.  Appearance: She is morbidly obese. She is not ill-appearing, toxic-appearing or diaphoretic.  HENT:     Head: Normocephalic.  Eyes:     General: No scleral icterus.       Right eye: No discharge.        Left eye: No discharge.  Cardiovascular:     Rate and Rhythm: Normal rate.  Pulmonary:     Effort: Pulmonary effort is normal.  Abdominal:      General: Abdomen is protuberant. Bowel sounds are normal. There is no distension. There are no signs of injury.     Palpations: Abdomen is soft. There is no mass or pulsatile mass.     Tenderness: There is no abdominal tenderness. There is no right CVA tenderness, left CVA tenderness, guarding or rebound.     Hernia: There is no hernia in the umbilical area or ventral area.     Comments: Complains of diffuse abdominal cramping  Skin:    General: Skin is warm and dry.  Neurological:     General: No focal deficit present.     Mental Status: She is alert.  Psychiatric:        Behavior: Behavior is cooperative.    ED Results / Procedures / Treatments   Labs (all labs ordered are listed, but only abnormal results are displayed) Labs Reviewed  COMPREHENSIVE METABOLIC PANEL - Abnormal; Notable for the following components:      Result Value   Glucose, Bld 124 (*)    Calcium 8.7 (*)    All other components within normal limits  RESP PANEL BY RT-PCR (FLU A&B, COVID) ARPGX2  LIPASE, BLOOD  CBC  I-STAT BETA HCG BLOOD, ED ([facility], [facility], [facility] ONLY)    EKG None  Radiology No results found.  Procedures Procedures  {Document cardiac monitor, telemetry assessment procedure when appropriate:1}  Medications Ordered in ED Medications  sodium chloride 0.9 % bolus 1,000 mL (has no administration in time range)  dicyclomine (BENTYL) injection 20 mg (has no administration in time range)  ondansetron (ZOFRAN) injection 4 mg (has no administration in time range)    ED Course/ Medical Decision Making/ A&P                           Medical Decision Making Amount and/or Complexity of Data Reviewed Labs: ordered.  Risk Prescription drug management.   Alert 41 year old female in no acute distress, nontoxic-appearing.  Presents emergency department with complaint of nausea, vomiting, diarrhea, and abdominal cramping.  Information is obtained from patient.  Past medical records are reviewed  including previous labs and imaging.  Patient has medical history as outlined in HPI which complicates her care.  Due to patient's abdominal pain will obtain CBC, CMP, lipase, urinalysis, beta hCG.  We will hold any CT imaging at this time as patient's abdomen is soft, nondistended, and nontender.  We will give patient Bentyl for cramping, Zofran for nausea, and fluid bolus.  I personally reviewed and interpreted patient's labs.  Pertinent findings include: -Beta-hCG negative -CBC unremarkable -Lipase within normal limits -AST, ALT, alk phos, total bili all within normal limits  ***  {Document critical care time when appropriate:1} {Document review of labs and clinical decision tools ie heart score, Chads2Vasc2 etc:1}  {Document your independent review of radiology images, and any outside records:1} {Document your discussion with family members, caretakers, and with consultants:1} {Document social determinants of health affecting pt's care:1} {Document your decision making why or why not  admission, treatments were needed:1} Final Clinical Impression(s) / ED Diagnoses Final diagnoses:  None    Rx / DC Orders ED Discharge Orders     None

## 2021-09-30 NOTE — Discharge Instructions (Addendum)
You came to the emergency department today to evaluate for nausea, vomiting, diarrhea, and abdominal cramping.  The CT scan obtained showed signs consistent with enteritis.  This is usually caused by a viral infection.  I have given you prescription for medications to help with your symptoms.  Please follow-up with the GI team for further evaluation. ? ?Please call your primary care physician to discuss your Ozempic as medication can cause side effects consistent with your symptoms. ? ?Get help right away if you: ?Have chest pain. ?Feel extremely weak or you faint. ?See blood in your vomit. ?Have vomit that looks like coffee grounds. ?Have bloody or black stools or stools that look like tar. ?Have a severe headache, a stiff neck, or both. ?Have a rash. ?Have severe pain, cramping, or bloating in your abdomen. ?Have trouble breathing or you are breathing very quickly. ?Have a fast heartbeat. ?Have skin that feels cold and clammy. ?Feel confused. ?Have pain when you urinate. ?Have signs of dehydration, such as: ?Dark urine, very little urine, or no urine. ?Cracked lips. ?Dry mouth. ?Sunken eyes. ?Sleepiness. ?Weakness. ?

## 2021-09-30 NOTE — ED Triage Notes (Signed)
N//V/D developed last night, vomited x 3 this morning, 3-4 times with diarrhea. ?

## 2021-09-30 NOTE — ED Notes (Signed)
Pt states, "I aint gotta pee" when asked if she can give a urine sample. Pt aware a urine sample is necessary for her current work-up and states, "I done thrown-up and everything." Unable to obtain urine sample at this time.  ?

## 2021-09-30 NOTE — ED Provider Triage Note (Signed)
Emergency Medicine Provider Triage Evaluation Note ? ?Vickie Ford , a 41 y.o. female  was evaluated in triage.  Pt complains of nausea, vomiting, diarrhea, and abdominal cramping.  Patient reports that her symptoms started yesterday evening.  Abdominal cramping is located throughout her entire abdomen. ? ?LMP was "sometime last month."  History of cesarean section.  Patient recently started Ozempic 3 weeks prior.  No recent antibiotic use. ? ?Denies any fever, chills, vaginal pain, vaginal bleeding, vaginal discharge, dysuria, hematuria, urinary urgency, urinary frequency, blood in stool, melena, constipation, hematemesis, coffee-ground emesis. ? ?Review of Systems  ?Positive: Nausea, vomiting, diarrhea, abdominal cramping ?Negative: See above ? ?Physical Exam  ?BP (!) 168/109 (BP Location: Left Arm)   Pulse 86   Temp 98.2 ?F (36.8 ?C) (Oral)   Resp 20   Ht 5\' 8"  (1.727 m)   Wt (!) 168.6 kg   BMI 56.52 kg/m?  ?Gen:   Awake, no distress   ?Resp:  Normal effort  ?MSK:   Moves extremities without difficulty  ?Other:  Abdomen soft, nondistended, nontender with no guarding or rebound tenderness. ? ?Medical Decision Making  ?Medically screening exam initiated at 2:15 PM.  Appropriate orders placed.  Vickie Ford was informed that the remainder of the evaluation will be completed by another provider, this initial triage assessment does not replace that evaluation, and the importance of remaining in the ED until their evaluation is complete. ? ? ?  ? , PA-C ?09/30/21 1416 ? ?

## 2021-09-30 NOTE — ED Notes (Signed)
Pt to CT at this time.

## 2021-10-03 DIAGNOSIS — M129 Arthropathy, unspecified: Secondary | ICD-10-CM | POA: Diagnosis not present

## 2021-10-03 DIAGNOSIS — Z79899 Other long term (current) drug therapy: Secondary | ICD-10-CM | POA: Diagnosis not present

## 2021-10-03 DIAGNOSIS — R1084 Generalized abdominal pain: Secondary | ICD-10-CM | POA: Diagnosis not present

## 2021-10-08 DIAGNOSIS — Z79899 Other long term (current) drug therapy: Secondary | ICD-10-CM | POA: Diagnosis not present

## 2021-10-08 NOTE — Progress Notes (Signed)
? ? ? ?10/10/2021 ?Vickie Ford ?[id number] ?1980/09/18 ? ? ?ASSESSMENT AND PLAN:  ? ?Diarrhea, unspecified type ?-     CBC with Differential/Platelet; Future ?-     Comprehensive metabolic panel; Future ?-     C-reactive protein; Future ?-     Sedimentation rate; Future ?-     Calprotectin, Fecal; Future ?-     DG Abd 1 View; Future ?-     IgA; Future ?-     Tissue transglutaminase, IgA; Future ?Possible overflow incontinence from constipation ?Reviewed CT myself and did appear to have stool throughout colon.  ?Recent oxycodone and ozempic with worsening constipation.  ?Will get Xray to evaluate for obstruction, if negative suggest trying linzess samples 145, and talk with PCP about discontinuing ozempic versus cutting back on narcotics.  ?Normal colon 2020.  ? ?Enteritis ?No AB pain today, no nausea, vomiting,  ?? Infectious ?Can consider CT enterography versus EGD if inflammatory labs are positive or if symptoms return/do not resolve ? ?Morbid obesity ([place]) ?Weight loss discussed.  ? ?Hemorrhoids, unspecified hemorrhoid type ?-     hydrocortisone (ANUSOL-HC) 2.5 % rectal cream; Place 1 application. rectally 2 (two) times daily as needed for hemorrhoids. ?Declined rectal exam today, will do next OV.  ? ?Patient Care Team: ?[facility] [place] as PCP - General ? ?HISTORY OF PRESENT ILLNESS: ?41 y.o. female referred by [facility] [place], with a past medical history of HTN, anemia, Csection and others listed below presents for evaluation of enteritis.  ? ?03/23/19 Colonoscopy for hematochezia with Dr. Paulita Fujita external hemorrhoids, otherwise unremarkable, no biopsies taken. Recall 10 years.  ?09/30/21 CT AB and pelvis with contrast for AB pain felt like contractions N/V, diarrhea showed normal GB, liver, pancrease, spleen, normal stomach. Normal colon. Enteritis. Small amount of nonspecific free fluid in cul-de-sac and adjacent to the uterine fundus, likely reactive. ?No GI pathogen panel was done, She  had no anemia, no leukocytosis.  ?Negative lipase.  ? ?Patient had Nausea vomiting and diarrhea on 09/29/2020.  ?Had no melena, hematochezia. She had no hematemesis.  ?Denies fever, chills.  ?Given zofran, bentyl.  ? ?She has been on ozempic for 1 month, has been having constipation for a week.  ?She states she had a BM an hour ago, had to wait 35 mins, hard stools straining with worsening hemorrhoids. Sees BRB in stool/TP.  ?She is on oxycodone every day due to back pain, has been on it for 1-2 months.  ?She is on marijuana use daily. No nausea or vomiting.  ?She has AB pain with BM's, better after BM.  ?She is on her menses and has some pain.  ?No family history of GI cancers, no family history of IBD.  ?No extraintestinal symptoms.  ? ?External labs and notes reviewed this visit: ?CBC  09/30/2021  ?HGB 12.9 MCV 89.0  ?WBC 7.3 Platelets 306 ?Kidney function 09/30/2021  ?BUN 8 Cr 0.68  ?GFR >60  ?Potassium 4.1   ?LFTs 09/30/2021  ?AST 17 ALT 17 ?Alkphos 56 TBili 0.4 ?09/30/2021 LIPASE 35 ? ?Current Medications:  ? ?Current Outpatient Medications (Endocrine & Metabolic):  ?  OZEMPIC, 0.25 OR 0.5 MG/DOSE, 2 MG/1.5ML SOPN, Inject into the skin. ? ?Current Outpatient Medications (Cardiovascular):  ?  amLODipine (NORVASC) 5 MG tablet, Take 5 mg by mouth daily. ?  atorvastatin (LIPITOR) 10 MG tablet, Take 10 mg by mouth daily. ?  hydrochlorothiazide (HYDRODIURIL) 25 MG tablet, Take 25 mg by mouth daily. ?  labetalol (NORMODYNE) 200 MG  tablet, Take 1 tablet (200 mg total) by mouth 2 (two) times daily. ? ?Current Outpatient Medications (Respiratory):  ?  promethazine (PHENERGAN) 25 MG tablet, Take 0.5-1 tablets (12.5-25 mg total) by mouth every 6 (six) hours as needed for nausea or vomiting. ? ?Current Outpatient Medications (Analgesics):  ?  oxyCODONE-acetaminophen (PERCOCET/ROXICET) 5-325 MG tablet, Take 1 tablet by mouth 3 (three) times daily as needed. ? ? ?Current Outpatient Medications (Other):  ?  D3-50 1.25 MG (50000  UT) capsule, Take 50,000 Units by mouth once a week. ?  dicyclomine (BENTYL) 20 MG tablet, Take 1 tablet (20 mg total) by mouth 2 (two) times daily. ?  hydrocortisone (ANUSOL-HC) 2.5 % rectal cream, Place 1 application. rectally 2 (two) times daily as needed for hemorrhoids. ?  metroNIDAZOLE (FLAGYL) 500 MG tablet, Take 500 mg by mouth 3 (three) times daily. ?  ondansetron (ZOFRAN-ODT) 4 MG disintegrating tablet, Take 1 tablet (4 mg total) by mouth every 8 (eight) hours as needed for nausea or vomiting. ?  pregabalin (LYRICA) 50 MG capsule, Take 50 mg by mouth 2 (two) times daily. ? ?Medical History:  ?Past Medical History:  ?Diagnosis Date  ? Anemia   ? Headache   ? Hemorrhoid   ? Hypertension   ? Nerve damage   ? ?Allergies:  ?Allergies  ?Allergen Reactions  ? Penicillin G Anaphylaxis  ?  Unknown  ? Penicillins   ?  Hands and feet peeled ?Did it involve swelling of the face/tongue/throat, SOB, or low BP? No ?Did it involve sudden or severe rash/hives, skin peeling, or any reaction on the inside of your mouth or nose? Yes ?Did you need to seek medical attention at a hospital or doctor's office? Unknown ?When did it last happen?      Childhood allergy ?If all above answers are ?NO?, may proceed with cephalosporin use. ?  ?  ? ?Surgical History:  ?She  has a past surgical history that includes Cesarean section and Colonoscopy with propofol (N/A, 03/23/2019). ?Family History:  ?Her family history includes Diabetes in her mother; Hypertension in her father. ?Social History:  ? reports that she has been smoking cigarettes. She has a 2.25 pack-year smoking history. She has never used smokeless tobacco. She reports current alcohol use. She reports that she does not currently use drugs after having used the following drugs: Marijuana. ? ?REVIEW OF SYSTEMS  : All other systems reviewed and negative except where noted in the History of Present Illness. ? ? ?PHYSICAL EXAM: ?BP 128/84   Pulse 77   Ht 5\' 7"  (1.702 m)   Wt (!)  379 lb 6.4 oz (172.1 kg)   SpO2 99%   BMI 59.42 kg/m?  ?General:   Pleasant, well developed female in no acute distress ?Head:  Normocephalic and atraumatic. ?Eyes: sclerae anicteric,conjunctive pink  ?Heart:  regular rate and rhythm ?Pulm: Clear anteriorly; no wheezing ?Abdomen:  Soft, Obese AB, skin exam normal, Normal bowel sounds. mild tenderness in the epigastrium. Without guarding and Without rebound, without hepatomegaly. ?Rectal: declines ?Extremities:  Without edema. ?Msk:  Symmetrical without gross deformities. Peripheral pulses intact.  ?Neurologic:  Alert and  oriented x4;  grossly normal neurologically. ?Skin:   Dry and intact without significant lesions or rashes. ?Psychiatric: Demonstrates good judgement and reason without abnormal affect or behaviors. ? ? ?Vladimir Crofts, PA-C ?3:47 PM ? ? ?

## 2021-10-10 ENCOUNTER — Encounter: Payer: Self-pay | Admitting: Physician Assistant

## 2021-10-10 ENCOUNTER — Other Ambulatory Visit: Payer: Self-pay

## 2021-10-10 ENCOUNTER — Ambulatory Visit (INDEPENDENT_AMBULATORY_CARE_PROVIDER_SITE_OTHER): Payer: BC Managed Care – PPO | Admitting: Physician Assistant

## 2021-10-10 ENCOUNTER — Other Ambulatory Visit (INDEPENDENT_AMBULATORY_CARE_PROVIDER_SITE_OTHER): Payer: Medicaid Other

## 2021-10-10 VITALS — BP 128/84 | HR 77 | Ht 67.0 in | Wt 379.4 lb

## 2021-10-10 DIAGNOSIS — K529 Noninfective gastroenteritis and colitis, unspecified: Secondary | ICD-10-CM

## 2021-10-10 DIAGNOSIS — R197 Diarrhea, unspecified: Secondary | ICD-10-CM | POA: Diagnosis not present

## 2021-10-10 DIAGNOSIS — K649 Unspecified hemorrhoids: Secondary | ICD-10-CM | POA: Diagnosis not present

## 2021-10-10 LAB — CBC WITH DIFFERENTIAL/PLATELET
Basophils Absolute: 0 10*3/uL (ref 0.0–0.1)
Basophils Relative: 0.6 % (ref 0.0–3.0)
Eosinophils Absolute: 0.1 10*3/uL (ref 0.0–0.7)
Eosinophils Relative: 2.5 % (ref 0.0–5.0)
HCT: 36.3 % (ref 36.0–46.0)
Hemoglobin: 11.9 g/dL — ABNORMAL LOW (ref 12.0–15.0)
Lymphocytes Relative: 36.2 % (ref 12.0–46.0)
Lymphs Abs: 1.8 10*3/uL (ref 0.7–4.0)
MCHC: 32.9 g/dL (ref 30.0–36.0)
MCV: 88.1 fl (ref 78.0–100.0)
Monocytes Absolute: 0.3 10*3/uL (ref 0.1–1.0)
Monocytes Relative: 6.7 % (ref 3.0–12.0)
Neutro Abs: 2.7 10*3/uL (ref 1.4–7.7)
Neutrophils Relative %: 54 % (ref 43.0–77.0)
Platelets: 356 10*3/uL (ref 150.0–400.0)
RBC: 4.12 Mil/uL (ref 3.87–5.11)
RDW: 13.8 % (ref 11.5–15.5)
WBC: 5.1 10*3/uL (ref 4.0–10.5)

## 2021-10-10 LAB — COMPREHENSIVE METABOLIC PANEL
ALT: 18 U/L (ref 0–35)
AST: 14 U/L (ref 0–37)
Albumin: 4 g/dL (ref 3.5–5.2)
Alkaline Phosphatase: 57 U/L (ref 39–117)
BUN: 12 mg/dL (ref 6–23)
CO2: 27 mEq/L (ref 19–32)
Calcium: 9 mg/dL (ref 8.4–10.5)
Chloride: 105 mEq/L (ref 96–112)
Creatinine, Ser: 0.74 mg/dL (ref 0.40–1.20)
GFR: 100.63 mL/min (ref >60.00)
Glucose, Bld: 82 mg/dL (ref 70–99)
Potassium: 4.2 mEq/L (ref 3.5–5.1)
Sodium: 138 mEq/L (ref 135–145)
Total Bilirubin: 0.2 mg/dL (ref 0.2–1.2)
Total Protein: 7 g/dL (ref 6.0–8.3)

## 2021-10-10 LAB — C-REACTIVE PROTEIN: CRP: 1.3 mg/dL (ref 0.5–20.0)

## 2021-10-10 LAB — SEDIMENTATION RATE: Sed Rate: 33 mm/hr — ABNORMAL HIGH (ref 0–20)

## 2021-10-10 MED ORDER — HYDROCORTISONE (PERIANAL) 2.5 % EX CREA: 1.0000 "application " | TOPICAL_CREAM | Freq: Two times a day (BID) | CUTANEOUS | 0 refills | Status: AC | PRN

## 2021-10-10 NOTE — Patient Instructions (Addendum)
Your provider has requested that you go to the basement level for lab work before leaving today. Press "B" on the elevator. The lab is located at the first door on the left as you exit the elevator. ? ?Your provider has requested that you go to the basement level for an x ray before leaving today. Press "B" on the elevator. X ray is located to the left as you exit the elevator. ? ? ?Linzess SAMPLES CAN TAKE AFTER THE XRAY ?Take at least 30 minutes before the first meal of the day on an empty stomach ?You can have a loose stool if you eat a high-fat breakfast. ?Give it at least 4 days, may have more bowel movements during that time.  ?If you continue to have severe diarrhea try every other day, if you get AB pain with it stop.  ?After you are out we can send in a prescription if you did well, there is a prescription card ? ?Linaclotide oral capsules ?What is this medicine? ?LINACLOTIDE (lin a KLOE tide) is used to treat irritable bowel syndrome (IBS) with constipation as the main problem. It may also be used for relief of chronic constipation. ?This medicine may be used for other purposes; ask your health care provider or pharmacist if you have questions. ?COMMON BRAND NAME(S): Linzess ?What should I tell my health care provider before I take this medicine? ?They need to know if you have any of these conditions: ?history of stool (fecal) impaction ?now have diarrhea or have diarrhea often ?other medical condition ?stomach or intestinal disease, including bowel obstruction or abdominal adhesions ?an unusual or allergic reaction to linaclotide, other medicines, foods, dyes, or preservatives ?pregnant or trying to get pregnant ?breast-feeding ?How should I use this medicine? ?Take this medicine by mouth with a glass of water. Follow the directions on the prescription label. Do not cut, crush or chew this medicine. Take on an empty stomach, at least 30 minutes before your first meal of the day. Take your medicine at  regular intervals. Do not take your medicine more often than directed. Do not stop taking except on your doctor's advice. ?A special MedGuide will be given to you by the pharmacist with each prescription and refill. Be sure to read this information carefully each time. ?Talk to your pediatrician regarding the use of this medicine in children. This medicine is not approved for use in children. ?Overdosage: If you think you have taken too much of this medicine contact a poison control center or emergency room at once. ?NOTE: This medicine is only for you. Do not share this medicine with others. ?What if I miss a dose? ?If you miss a dose, just skip that dose. Wait until your next dose, and take only that dose. Do not take double or extra doses. ?What may interact with this medicine? ?certain medicines for bowel problems or bladder incontinence (these can cause constipation) ?This list may not describe all possible interactions. Give your health care provider a list of all the medicines, herbs, non-prescription drugs, or dietary supplements you use. Also tell them if you smoke, drink alcohol, or use illegal drugs. Some items may interact with your medicine. ?What should I watch for while using this medicine? ?Visit your doctor for regular check ups. Tell your doctor if your symptoms do not get better or if they get worse. ?Diarrhea is a common side effect of this medicine. It often begins within 2 weeks of starting this medicine. Stop taking this medicine and call  your doctor if you get severe diarrhea. ?Stop taking this medicine and call your doctor or go to the nearest hospital emergency room right away if you develop unusual or severe stomach-area (abdominal) pain, especially if you also have bright red, bloody stools or black stools that look like tar. ?What side effects may I notice from receiving this medicine? ?Side effects that you should report to your doctor or health care professional as soon as  possible: ?allergic reactions like skin rash, itching or hives, swelling of the face, lips, or tongue ?black, tarry stools ?bloody or watery diarrhea ?new or worsening stomach pain ?severe or prolonged diarrhea ?Side effects that usually do not require medical attention (report to your doctor or health care professional if they continue or are bothersome): ?bloating ?gas ?loose stools ?This list may not describe all possible side effects. Call your doctor for medical advice about side effects. You may report side effects to FDA at 1-800-FDA-1088. ?Where should I keep my medicine? ?Keep out of the reach of children. ?Store at room temperature between 20 and 25 degrees C (68 and 77 degrees F). Keep this medicine in the original container. Keep tightly closed in a dry place. Do not remove the desiccant packet from the bottle, it helps to protect your medicine from moisture. Throw away any unused medicine after the expiration date. ?NOTE: This sheet is a summary. It may not cover all possible information. If you have questions about this medicine, talk to your doctor, pharmacist, or health care provider. ?? 2020 Elsevier/Gold Standard (2015-08-09 12:17:04) ? ? ?Recommend starting on a fiber supplement, can try metamucil first but if this causes gas/bloating switch to benefiber ?Take with fiber with with a full 8 oz glass of water once a day. This can take 1 month to start helping, so try for at least one month.  ?Recommend increasing water and activity.  ?Get a squatty potty to use at home or try a stool, goal is to get your knees above your hips during a bowel movement. ? ?- Drink at least 64-80 ounces of water/liquid per day. ?- Establish a time to try to move your bowels every day.  For many people, this is after a cup of coffee or after a meal such as breakfast. ?- Sit all of the way back on the toilet keeping your back fairly straight and while sitting up, try to rest the tops of your forearms on your upper thighs.    ?- Raising your feet with a step stool/squatty potty can be helpful to improve the angle that allows your stool to pass through the rectum. ?- Relax the rectum feeling it bulge toward the toilet water.  If you feel your rectum raising toward your body, you are contracting rather than relaxing. ?- Breathe in and slowly exhale. "Belly breath" by expanding your belly towards your belly button. Keep belly expanded as you gently direct pressure down and back to the anus.  A low pitched GRRR sound can assist with increasing intra-abdominal pressure.  ?- Repeat 3-4 times. If unsuccessful, contract the pelvic floor to restore normal tone and get off the toilet.  Avoid excessive straining. ?- To reduce excessive wiping by teaching your anus to normally contract, place hands on outer aspect of knees and resist knee movement outward.  Hold 5-10 second then place hands just inside of knees and resist inward movement of knees.  Hold 5 seconds.  Repeat a few times each way. ? ?Go to the ER if unable  to pass gas, severe AB pain, unable to hold down food, any shortness of breath of chest pain.  ? ?Constipation, Adult ?Constipation is when a person has fewer than three bowel movements in a week, has difficulty having a bowel movement, or has stools (feces) that are dry, hard, or larger than normal. Constipation may be caused by an underlying condition. It may become worse with age if a person takes certain medicines and does not take in enough fluids. ?Follow these instructions at home: ?Eating and drinking ? ?Eat foods that have a lot of fiber, such as beans, whole grains, and fresh fruits and vegetables. ?Limit foods that are low in fiber and high in fat and processed sugars, such as fried or sweet foods. These include french fries, hamburgers, cookies, candies, and soda. ?Drink enough fluid to keep your urine pale yellow. ?General instructions ?Exercise regularly or as told by your health care provider. Try to do 150 minutes of  moderate exercise each week. ?Use the bathroom when you have the urge to go. Do not hold it in. ?Take over-the-counter and prescription medicines only as told by your health care provider. This includes any fiber s

## 2021-10-11 NOTE — Progress Notes (Signed)
Addendum: Reviewed and agree with assessment and management plan. Arsenia Goracke M, MD  

## 2021-10-14 ENCOUNTER — Other Ambulatory Visit: Payer: Self-pay

## 2021-10-14 DIAGNOSIS — R197 Diarrhea, unspecified: Secondary | ICD-10-CM

## 2021-10-14 LAB — IGA: Immunoglobulin A: 138 mg/dL (ref 47–310)

## 2021-10-14 LAB — TISSUE TRANSGLUTAMINASE, IGA: (tTG) Ab, IgA: <1 U/mL

## 2021-10-28 DIAGNOSIS — Z79899 Other long term (current) drug therapy: Secondary | ICD-10-CM | POA: Diagnosis not present

## 2021-11-28 DIAGNOSIS — Z79899 Other long term (current) drug therapy: Secondary | ICD-10-CM | POA: Diagnosis not present

## 2021-12-04 DIAGNOSIS — Z79899 Other long term (current) drug therapy: Secondary | ICD-10-CM | POA: Diagnosis not present

## 2021-12-17 DIAGNOSIS — Z79899 Other long term (current) drug therapy: Secondary | ICD-10-CM | POA: Diagnosis not present

## 2021-12-20 DIAGNOSIS — Z79899 Other long term (current) drug therapy: Secondary | ICD-10-CM | POA: Diagnosis not present

## 2022-01-17 DIAGNOSIS — Z79899 Other long term (current) drug therapy: Secondary | ICD-10-CM | POA: Diagnosis not present

## 2022-01-23 DIAGNOSIS — Z79899 Other long term (current) drug therapy: Secondary | ICD-10-CM | POA: Diagnosis not present

## 2022-02-16 DIAGNOSIS — R635 Abnormal weight gain: Secondary | ICD-10-CM | POA: Diagnosis not present

## 2022-02-16 DIAGNOSIS — F339 Major depressive disorder, recurrent, unspecified: Secondary | ICD-10-CM | POA: Diagnosis not present

## 2022-02-16 DIAGNOSIS — Z131 Encounter for screening for diabetes mellitus: Secondary | ICD-10-CM | POA: Diagnosis not present

## 2022-02-16 DIAGNOSIS — Z79899 Other long term (current) drug therapy: Secondary | ICD-10-CM | POA: Diagnosis not present

## 2022-02-16 DIAGNOSIS — E7800 Pure hypercholesterolemia, unspecified: Secondary | ICD-10-CM | POA: Diagnosis not present

## 2022-02-16 DIAGNOSIS — E8881 Metabolic syndrome: Secondary | ICD-10-CM | POA: Diagnosis not present

## 2022-02-16 DIAGNOSIS — E559 Vitamin D deficiency, unspecified: Secondary | ICD-10-CM | POA: Diagnosis not present

## 2022-02-26 ENCOUNTER — Emergency Department (HOSPITAL_COMMUNITY)
Admission: EM | Admit: 2022-02-26 | Discharge: 2022-02-26 | Disposition: A | Discharge status: Home / Self Care | Payer: BC Managed Care – PPO | Attending: Emergency Medicine | Admitting: Emergency Medicine

## 2022-02-26 ENCOUNTER — Encounter (HOSPITAL_COMMUNITY): Payer: Self-pay

## 2022-02-26 DIAGNOSIS — I10 Essential (primary) hypertension: Secondary | ICD-10-CM | POA: Diagnosis not present

## 2022-02-26 DIAGNOSIS — R0981 Nasal congestion: Secondary | ICD-10-CM | POA: Diagnosis not present

## 2022-02-26 DIAGNOSIS — Z79899 Other long term (current) drug therapy: Secondary | ICD-10-CM | POA: Insufficient documentation

## 2022-02-26 DIAGNOSIS — R519 Headache, unspecified: Secondary | ICD-10-CM

## 2022-02-26 LAB — CBC WITH DIFFERENTIAL/PLATELET
Abs Immature Granulocytes: 0.01 10*3/uL (ref 0.00–0.07)
Basophils Absolute: 0 10*3/uL (ref 0.0–0.1)
Basophils Relative: 0 %
Eosinophils Absolute: 0.1 10*3/uL (ref 0.0–0.5)
Eosinophils Relative: 2 %
HCT: 39.1 % (ref 36.0–46.0)
Hemoglobin: 12.8 g/dL (ref 12.0–15.0)
Immature Granulocytes: 0 %
Lymphocytes Relative: 38 %
Lymphs Abs: 1.9 10*3/uL (ref 0.7–4.0)
MCH: 29.2 pg (ref 26.0–34.0)
MCHC: 32.7 g/dL (ref 30.0–36.0)
MCV: 89.1 fL (ref 80.0–100.0)
Monocytes Absolute: 0.2 10*3/uL (ref 0.1–1.0)
Monocytes Relative: 5 %
Neutro Abs: 2.7 10*3/uL (ref 1.7–7.7)
Neutrophils Relative %: 55 %
Platelets: 296 10*3/uL (ref 150–400)
RBC: 4.39 MIL/uL (ref 3.87–5.11)
RDW: 13.2 % (ref 11.5–15.5)
WBC: 4.9 10*3/uL (ref 4.0–10.5)
nRBC: 0 % (ref 0.0–0.2)

## 2022-02-26 LAB — URINALYSIS, ROUTINE W REFLEX MICROSCOPIC
Bilirubin Urine: NEGATIVE
Glucose, UA: NEGATIVE mg/dL
Hgb urine dipstick: NEGATIVE
Ketones, ur: NEGATIVE mg/dL
Leukocytes,Ua: NEGATIVE
Nitrite: NEGATIVE
Protein, ur: NEGATIVE mg/dL
Specific Gravity, Urine: 1.012 (ref 1.005–1.030)
pH: 5 (ref 5.0–8.0)

## 2022-02-26 LAB — COMPREHENSIVE METABOLIC PANEL
ALT: 24 U/L (ref 0–44)
AST: 19 U/L (ref 15–41)
Albumin: 3.7 g/dL (ref 3.5–5.0)
Alkaline Phosphatase: 54 U/L (ref 38–126)
Anion gap: 6 (ref 5–15)
BUN: 8 mg/dL (ref 6–20)
CO2: 22 mmol/L (ref 22–32)
Calcium: 8.8 mg/dL — ABNORMAL LOW (ref 8.9–10.3)
Chloride: 107 mmol/L (ref 98–111)
Creatinine, Ser: 0.71 mg/dL (ref 0.44–1.00)
GFR, Estimated: >60 mL/min (ref >60)
Glucose, Bld: 118 mg/dL — ABNORMAL HIGH (ref 70–99)
Potassium: 3.8 mmol/L (ref 3.5–5.1)
Sodium: 135 mmol/L (ref 135–145)
Total Bilirubin: 0.5 mg/dL (ref 0.3–1.2)
Total Protein: 7 g/dL (ref 6.5–8.1)

## 2022-02-26 LAB — TROPONIN I (HIGH SENSITIVITY): Troponin I (High Sensitivity): 3 ng/L (ref <18)

## 2022-02-26 NOTE — ED Notes (Signed)
Pt roomed and tech aware pt needs to be in a gown. Pt wanted to use restroom beforehand.

## 2022-02-26 NOTE — ED Notes (Signed)
Pt A&OX4 ambulatory at d/c with independent steady gait. Pt verbalized understanding of d/c instructions and follow up care. 

## 2022-02-26 NOTE — ED Provider Triage Note (Signed)
Emergency Medicine Provider Triage Evaluation Note  RUBEN PYKA , a 41 y.o. female  was evaluated in triage.  Pt complains of eleavted BP, HA, and facial swelling. The patient reports she ahs been compliant withher BP medications. She reports that whenever her BP is high her face swells. Denies any chest pain, SOB, or unilateral weakness. Denies any trouble swallowing.  Review of Systems  Positive:  Negative:   Physical Exam  BP (!) 158/118   Pulse 82   Temp 98.8 F (37.1 C) (Oral)   Resp 18   Ht 5\' 7"  (1.702 m)   Wt (!) 167.8 kg   SpO2 98%   BMI 57.95 kg/m  Gen:   Awake, no distress   Resp:  Normal effort  MSK:   Moves extremities without difficulty  Other:  Mild perioribital swelling bilaterally  Medical Decision Making  Medically screening exam initiated at 11:31 AM.  Appropriate orders placed.  Cataleia L Krahenbuhl was informed that the remainder of the evaluation will be completed by another provider, this initial triage assessment does not replace that evaluation, and the importance of remaining in the ED until their evaluation is complete.  Will order labs   , Achille Rich 02/26/22 1133

## 2022-02-26 NOTE — ED Triage Notes (Signed)
Pt c/o headache and high blood pressure since waking up this morning. States she took her bp meds this morning with no relief.

## 2022-02-26 NOTE — ED Provider Notes (Signed)
[facility]-EMERGENCY DEPT Provider Note   CSN: [id number] Arrival date & time: 02/26/22  1113     History  Chief Complaint  Patient presents with   Hypertension   Headache    Vickie Ford is a 41 y.o. female.   Hypertension Associated symptoms include headaches.  Headache Patient presents with headache and hypertension.  Woke with it this morning.  Dull headache.  Frontal.  Also some nasal congestion.  Today states that she gets headaches like this when her blood pressure goes up.  Upon arrival blood pressure was elevated.  Is on amlodipine.  States she took the pills this morning.  States she was under a lot of stress yesterday.  No numbness or weakness.  States she does have some nasal congestion.  No numbness or weakness.  No confusion.  No chest pain.    Past Medical History:  Diagnosis Date   Anemia    Headache    Hemorrhoid    Hypertension    Nerve damage     Home Medications Prior to Admission medications   Medication Sig Start Date End Date Taking? Authorizing Provider  amLODipine (NORVASC) 5 MG tablet Take 5 mg by mouth daily. 10/03/21   [provider]  atorvastatin (LIPITOR) 10 MG tablet Take 10 mg by mouth daily. 07/27/21   [provider]  D3-50 1.25 MG (50000 UT) capsule Take 50,000 Units by mouth once a week. 10/03/21   [provider]  dicyclomine (BENTYL) 20 MG tablet Take 1 tablet (20 mg total) by mouth 2 (two) times daily. 09/30/21   Haskel Schroeder, PA-C  hydrochlorothiazide (HYDRODIURIL) 25 MG tablet Take 25 mg by mouth daily. 07/30/21   [provider]  hydrocortisone (ANUSOL-HC) 2.5 % rectal cream Place 1 application. rectally 2 (two) times daily as needed for hemorrhoids. 10/10/21   Doree Albee, PA-C  labetalol (NORMODYNE) 200 MG tablet Take 1 tablet (200 mg total) by mouth 2 (two) times daily. 06/29/19   Judeth Horn, NP  metroNIDAZOLE (FLAGYL) 500 MG tablet Take 500 mg by mouth 3  (three) times daily. 06/11/21   [provider]  ondansetron (ZOFRAN-ODT) 4 MG disintegrating tablet Take 1 tablet (4 mg total) by mouth every 8 (eight) hours as needed for nausea or vomiting. 09/30/21   Haskel Schroeder, PA-C  oxyCODONE-acetaminophen (PERCOCET/ROXICET) 5-325 MG tablet Take 1 tablet by mouth 3 (three) times daily as needed. 10/06/21   [provider]  OZEMPIC, 0.25 OR 0.5 MG/DOSE, 2 MG/1.5ML SOPN Inject into the skin. 10/09/21   [provider]  pregabalin (LYRICA) 50 MG capsule Take 50 mg by mouth 2 (two) times daily. 08/14/21   [provider]  promethazine (PHENERGAN) 25 MG tablet Take 0.5-1 tablets (12.5-25 mg total) by mouth every 6 (six) hours as needed for nausea or vomiting. 07/05/19   Donette Larry, CNM      Allergies    Penicillin g and Penicillins    Review of Systems   Review of Systems  Neurological:  Positive for headaches.    Physical Exam Updated Vital Signs BP 131/74   Pulse 77   Temp 98.7 F (37.1 C) (Oral)   Resp 11   Ht 5\' 7"  (1.702 m)   Wt (!) 167.8 kg   SpO2 100%   BMI 57.95 kg/m  Physical Exam Vitals and nursing note reviewed.  Constitutional:      Appearance: She is obese.  Eyes:     Pupils: Pupils are  equal, round, and reactive to light.  Cardiovascular:     Rate and Rhythm: Regular rhythm.  Abdominal:     Palpations: Abdomen is soft.  Musculoskeletal:     Cervical back: Neck supple.  Skin:    General: Skin is warm.  Neurological:     Mental Status: She is alert.     ED Results / Procedures / Treatments   Labs (all labs ordered are listed, but only abnormal results are displayed) Labs Reviewed  COMPREHENSIVE METABOLIC PANEL - Abnormal; Notable for the following components:      Result Value   Glucose, Bld 118 (*)    Calcium 8.8 (*)    All other components within normal limits  URINALYSIS, ROUTINE W REFLEX MICROSCOPIC - Abnormal; Notable for the following components:   Color, Urine  STRAW (*)    All other components within normal limits  CBC WITH DIFFERENTIAL/PLATELET  TROPONIN I (HIGH SENSITIVITY)  TROPONIN I (HIGH SENSITIVITY)    EKG EKG Interpretation  Date/Time:  Wednesday February 26 2022 11:32:40 EDT Ventricular Rate:  81 PR Interval:  151 QRS Duration: 96 QT Interval:  370 QTC Calculation: 430 R Axis:   49 Text Interpretation: Sinus rhythm Low voltage, precordial leads Confirmed by Benjiman Core [phone]) on 02/26/2022 5:34:43 PM  Radiology No results found.  Procedures Procedures    Medications Ordered in ED Medications - No data to display  ED Course/ Medical Decision Making/ A&P                           Medical Decision Making  Patient with hypertension and headache.  Dull headache.  Doubt intracranial hemorrhage.  States she gets a headache like this with her hypertension, however does have some nasal congestion.  Could be congestion type headache.  Discussed possibility of COVID but patient refused COVID testing.  Also does have elevated blood pressure.  Has come down on its own.  States she was under a lot of stress.  Is on antihypertensives at home already.  No confusion.  Kidney function good and EKG reassuring.  Doubt endorgan damage and with improved blood pressure do not think we need admission in the hospital.  Will discharge home with outpatient follow-up as needed.        Final Clinical Impression(s) / ED Diagnoses Final diagnoses:  Hypertension, unspecified type  Nonintractable headache, unspecified chronicity pattern, unspecified headache type    Rx / DC Orders ED Discharge Orders     None         Benjiman Core, MD 02/26/22 1738

## 2022-03-17 DIAGNOSIS — Z79899 Other long term (current) drug therapy: Secondary | ICD-10-CM | POA: Diagnosis not present

## 2022-03-20 DIAGNOSIS — Z79899 Other long term (current) drug therapy: Secondary | ICD-10-CM | POA: Diagnosis not present

## 2022-04-13 ENCOUNTER — Emergency Department (HOSPITAL_COMMUNITY): Payer: BC Managed Care – PPO

## 2022-04-13 ENCOUNTER — Encounter (HOSPITAL_COMMUNITY): Payer: Self-pay

## 2022-04-13 ENCOUNTER — Other Ambulatory Visit: Payer: Self-pay

## 2022-04-13 ENCOUNTER — Emergency Department (HOSPITAL_COMMUNITY)
Admission: EM | Admit: 2022-04-13 | Discharge: 2022-04-13 | Disposition: A | Discharge status: Home / Self Care | Payer: BC Managed Care – PPO | Attending: Emergency Medicine | Admitting: Emergency Medicine

## 2022-04-13 DIAGNOSIS — M79672 Pain in left foot: Secondary | ICD-10-CM | POA: Diagnosis not present

## 2022-04-13 DIAGNOSIS — I10 Essential (primary) hypertension: Secondary | ICD-10-CM | POA: Diagnosis not present

## 2022-04-13 DIAGNOSIS — Z79899 Other long term (current) drug therapy: Secondary | ICD-10-CM | POA: Insufficient documentation

## 2022-04-13 MED ORDER — IBUPROFEN 800 MG PO TABS
800.0000 mg | ORAL_TABLET | Freq: Once | ORAL | Status: AC
  Administered 2022-04-13: 800 mg via ORAL
  Filled 2022-04-13: qty 1

## 2022-04-13 NOTE — ED Provider Notes (Cosign Needed Addendum)
[facility] EMERGENCY DEPARTMENT Provider Note   CSN: [id number] Arrival date & time: 04/13/22  [id number]     History  No chief complaint on file.   HPI Vickie Ford is a 41 y.o. female with high cholesterol and hypertension presenting for foot pain. Started months ago.  Pain is located in the left heel and extends around to the top of her foot.  Also endorsing associated toe swelling.  Denies trauma.  States that range of motion and sensation are intact in the left foot.  Taking oxycodone for sciatica and also treatment for her acute foot pain.  Denies fever, nausea vomiting, diarrhea.  HPI     Home Medications Prior to Admission medications   Medication Sig Start Date End Date Taking? Authorizing Provider  amLODipine (NORVASC) 5 MG tablet Take 5 mg by mouth daily. 10/03/21   [provider]  atorvastatin (LIPITOR) 10 MG tablet Take 10 mg by mouth daily. 07/27/21   [provider]  D3-50 1.25 MG (50000 UT) capsule Take 50,000 Units by mouth once a week. 10/03/21   [provider]  dicyclomine (BENTYL) 20 MG tablet Take 1 tablet (20 mg total) by mouth 2 (two) times daily. 09/30/21   Loni Beckwith, PA-C  hydrochlorothiazide (HYDRODIURIL) 25 MG tablet Take 25 mg by mouth daily. 07/30/21   [provider]  hydrocortisone (ANUSOL-HC) 2.5 % rectal cream Place 1 application. rectally 2 (two) times daily as needed for hemorrhoids. 10/10/21   Vladimir Crofts, PA-C  labetalol (NORMODYNE) 200 MG tablet Take 1 tablet (200 mg total) by mouth 2 (two) times daily. 06/29/19   Jorje Guild, NP  metroNIDAZOLE (FLAGYL) 500 MG tablet Take 500 mg by mouth 3 (three) times daily. 06/11/21   [provider]  ondansetron (ZOFRAN-ODT) 4 MG disintegrating tablet Take 1 tablet (4 mg total) by mouth every 8 (eight) hours as needed for nausea or vomiting. 09/30/21   Loni Beckwith, PA-C  oxyCODONE-acetaminophen (PERCOCET/ROXICET) 5-325 MG  tablet Take 1 tablet by mouth 3 (three) times daily as needed. 10/06/21   [provider]  OZEMPIC, 0.25 OR 0.5 MG/DOSE, 2 MG/1.5ML SOPN Inject into the skin. 10/09/21   [provider]  pregabalin (LYRICA) 50 MG capsule Take 50 mg by mouth 2 (two) times daily. 08/14/21   [provider]  promethazine (PHENERGAN) 25 MG tablet Take 0.5-1 tablets (12.5-25 mg total) by mouth every 6 (six) hours as needed for nausea or vomiting. 07/05/19   Julianne Handler, CNM      Allergies    Penicillin g and Penicillins    Review of Systems   Review of Systems  Musculoskeletal:        Foot and ankle pain    Physical Exam Updated Vital Signs BP 123/88 (BP Location: Right Arm)   Pulse 92   Temp 98.3 F (36.8 C) (Oral)   Resp 16   SpO2 95%  Physical Exam Constitutional:      Appearance: Normal appearance.  HENT:     Head: Normocephalic.     Nose: Nose normal.  Eyes:     Conjunctiva/sclera: Conjunctivae normal.  Pulmonary:     Effort: Pulmonary effort is normal.  Musculoskeletal:     Right foot: Normal.     Left foot: Swelling present.     Comments: Swelling noted about the dorsal aspect of the left foot near the ankle joint.  Neurological:     Mental Status: She is alert.  Psychiatric:  Mood and Affect: Mood normal.     ED Results / Procedures / Treatments   Labs (all labs ordered are listed, but only abnormal results are displayed) Labs Reviewed - No data to display  EKG None  Radiology [place] LT LOWER EXTREM LTD SOFT TISSUE NON VASCULAR  Result Date: 04/13/2022 CLINICAL DATA:  Left foot pain. Clinical concern for dorsal left foot abscess. EXAM: ULTRASOUND LEFT LOWER EXTREMITY LIMITED TECHNIQUE: Ultrasound examination of the lower extremity soft tissues was performed in the area of clinical concern. COMPARISON:  Left foot radiographs from earlier today FINDINGS: No fluid collections or masses demonstrated in the dorsal left foot in the region of clinical  concern. Comparison views of the dorsal right foot are unremarkable. IMPRESSION: No fluid collections or masses demonstrated in the dorsal left foot in the region of clinical concern. Electronically Signed   By: Ilona Sorrel M.D.   On: 04/13/2022 14:13   DG Foot Complete Left  Result Date: 04/13/2022 CLINICAL DATA:  Pain EXAM: LEFT FOOT - COMPLETE 3+ VIEW COMPARISON:  None Available. FINDINGS: There is no evidence of fracture or dislocation. There is no evidence of arthropathy or other focal bone abnormality. Soft tissues are unremarkable. IMPRESSION: Negative. Electronically Signed   By: Dorise Bullion III M.D.   On: 04/13/2022 10:11    Procedures Procedures    Medications Ordered in ED Medications - No data to display  ED Course/ Medical Decision Making/ A&P                           Medical Decision Making Amount and/or Complexity of Data Reviewed Radiology: ordered.  Risk Prescription drug management.   Patient presented for left foot pain. X-ray of the left foot did not reveal concern for dislocation or fracture.  On exam did note some swelling about the dorsal aspect of the left foot concerning for possible cyst and/or abscess take ultrasound.  Fortunately ultrasound was unremarkable.  Upon reevaluation patient stated that the bottom of her foot is exquisitely painful and has been for a long time.  At that point I was concern for possible plantar fasciitis.  Treated her pain with ibuprofen.  Gave her crutches and applied ankle Aircast and recommended her to podiatry.  Recommended Tylenol and ibuprofen as needed for pain.  Also recommend that she follow-up with her PCP regarding ongoing left foot pain.           Final Clinical Impression(s) / ED Diagnoses Final diagnoses:  Left foot pain    Rx / DC Orders ED Discharge Orders     None         Harriet Pho, PA-C 04/13/22 1425    Harriet Pho, PA-C 04/13/22 1434    Audley Hose, MD 04/15/22  1011

## 2022-04-13 NOTE — Progress Notes (Signed)
Orthopedic Tech Progress Note Patient Details:  Vickie Ford Mar 08, 1981 [id number]  Ortho Devices Type of Ortho Device: Crutches, CAM walker Ortho Device/Splint Interventions: Ordered, Application, Adjustment   Post Interventions Patient Tolerated: Well Instructions Provided: Adjustment of device, Care of device, Poper ambulation with device  [name] 04/13/2022, 3:26 PM

## 2022-04-13 NOTE — Discharge Instructions (Addendum)
Work-up for your left foot pain is overall reassuring.  X-ray did not reveal concern for dislocation or fracture.  Ultrasound of the left foot was also negative.  Recommend that you follow-up with your PCP regarding ongoing left foot pain.  Also recommend that you take Tylenol and ibuprofen as needed.  Given that your left foot pain is also the bottom of your foot and it hurts to bear weight and you are having trouble walking I am concerned that you may have plantar fasciitis which is why I am referring you to podiatry (foot doctor).  In the meantime take ibuprofen and Tylenol as needed for pain.  Also recommended rest and ice of the foot.

## 2022-04-13 NOTE — ED Triage Notes (Signed)
Patient complains of months of left foot pain with no injury, pain worse with ambulation

## 2022-04-14 DIAGNOSIS — E7800 Pure hypercholesterolemia, unspecified: Secondary | ICD-10-CM | POA: Diagnosis not present

## 2022-04-14 DIAGNOSIS — M546 Pain in thoracic spine: Secondary | ICD-10-CM | POA: Diagnosis not present

## 2022-04-14 DIAGNOSIS — I10 Essential (primary) hypertension: Secondary | ICD-10-CM | POA: Diagnosis not present

## 2022-04-14 DIAGNOSIS — Z79899 Other long term (current) drug therapy: Secondary | ICD-10-CM | POA: Diagnosis not present

## 2022-04-14 DIAGNOSIS — Z6841 Body Mass Index (BMI) 40.0 and over, adult: Secondary | ICD-10-CM | POA: Diagnosis not present

## 2022-04-14 DIAGNOSIS — R7302 Impaired glucose tolerance (oral): Secondary | ICD-10-CM | POA: Diagnosis not present

## 2022-04-14 DIAGNOSIS — G8929 Other chronic pain: Secondary | ICD-10-CM | POA: Diagnosis not present

## 2022-04-14 DIAGNOSIS — M545 Low back pain, unspecified: Secondary | ICD-10-CM | POA: Diagnosis not present

## 2022-07-02 ENCOUNTER — Emergency Department (HOSPITAL_COMMUNITY)
Admission: EM | Admit: 2022-07-02 | Discharge: 2022-07-02 | Disposition: A | Discharge status: Home / Self Care | Payer: BC Managed Care – PPO | Attending: Student | Admitting: Student

## 2022-07-02 ENCOUNTER — Encounter (HOSPITAL_COMMUNITY): Payer: Self-pay

## 2022-07-02 DIAGNOSIS — E119 Type 2 diabetes mellitus without complications: Secondary | ICD-10-CM | POA: Insufficient documentation

## 2022-07-02 DIAGNOSIS — R109 Unspecified abdominal pain: Secondary | ICD-10-CM | POA: Diagnosis present

## 2022-07-02 DIAGNOSIS — N3000 Acute cystitis without hematuria: Secondary | ICD-10-CM | POA: Diagnosis not present

## 2022-07-02 DIAGNOSIS — R1084 Generalized abdominal pain: Secondary | ICD-10-CM | POA: Diagnosis not present

## 2022-07-02 LAB — CBC WITH DIFFERENTIAL/PLATELET
Abs Immature Granulocytes: 0.01 10*3/uL (ref 0.00–0.07)
Basophils Absolute: 0 10*3/uL (ref 0.0–0.1)
Basophils Relative: 0 %
Eosinophils Absolute: 0.3 10*3/uL (ref 0.0–0.5)
Eosinophils Relative: 5 %
HCT: 35.6 % — ABNORMAL LOW (ref 36.0–46.0)
Hemoglobin: 11.5 g/dL — ABNORMAL LOW (ref 12.0–15.0)
Immature Granulocytes: 0 %
Lymphocytes Relative: 32 %
Lymphs Abs: 1.6 10*3/uL (ref 0.7–4.0)
MCH: 28.6 pg (ref 26.0–34.0)
MCHC: 32.3 g/dL (ref 30.0–36.0)
MCV: 88.6 fL (ref 80.0–100.0)
Monocytes Absolute: 0.3 10*3/uL (ref 0.1–1.0)
Monocytes Relative: 6 %
Neutro Abs: 2.8 10*3/uL (ref 1.7–7.7)
Neutrophils Relative %: 57 %
Platelets: 322 10*3/uL (ref 150–400)
RBC: 4.02 MIL/uL (ref 3.87–5.11)
RDW: 13.2 % (ref 11.5–15.5)
WBC: 4.9 10*3/uL (ref 4.0–10.5)
nRBC: 0 % (ref 0.0–0.2)

## 2022-07-02 LAB — COMPREHENSIVE METABOLIC PANEL
ALT: 18 U/L (ref 0–44)
AST: 16 U/L (ref 15–41)
Albumin: 3.6 g/dL (ref 3.5–5.0)
Alkaline Phosphatase: 56 U/L (ref 38–126)
Anion gap: 5 (ref 5–15)
BUN: 8 mg/dL (ref 6–20)
CO2: 28 mmol/L (ref 22–32)
Calcium: 9.1 mg/dL (ref 8.9–10.3)
Chloride: 108 mmol/L (ref 98–111)
Creatinine, Ser: 0.69 mg/dL (ref 0.44–1.00)
GFR, Estimated: >60 mL/min (ref >60)
Glucose, Bld: 88 mg/dL (ref 70–99)
Potassium: 4.6 mmol/L (ref 3.5–5.1)
Sodium: 141 mmol/L (ref 135–145)
Total Bilirubin: 0.4 mg/dL (ref 0.3–1.2)
Total Protein: 7 g/dL (ref 6.5–8.1)

## 2022-07-02 LAB — I-STAT BETA HCG BLOOD, ED (MC, WL, AP ONLY): I-stat hCG, quantitative: <5 m[IU]/mL (ref <5)

## 2022-07-02 LAB — LIPASE, BLOOD: Lipase: 41 U/L (ref 11–51)

## 2022-07-02 LAB — URINALYSIS, ROUTINE W REFLEX MICROSCOPIC
Bilirubin Urine: NEGATIVE
Glucose, UA: NEGATIVE mg/dL
Ketones, ur: NEGATIVE mg/dL
Nitrite: NEGATIVE
Protein, ur: 100 mg/dL — AB
RBC / HPF: >50 RBC/hpf — ABNORMAL HIGH (ref 0–5)
Specific Gravity, Urine: 1.028 (ref 1.005–1.030)
WBC, UA: >50 WBC/hpf — ABNORMAL HIGH (ref 0–5)
pH: 5 (ref 5.0–8.0)

## 2022-07-02 MED ORDER — ONDANSETRON 4 MG PO TBDP: 4.0000 mg | ORAL_TABLET | Freq: Three times a day (TID) | ORAL | 0 refills | Status: AC | PRN

## 2022-07-02 MED ORDER — NITROFURANTOIN MONOHYD MACRO 100 MG PO CAPS: 100.0000 mg | ORAL_CAPSULE | Freq: Two times a day (BID) | ORAL | 0 refills | Status: AC

## 2022-07-02 MED ORDER — ONDANSETRON 4 MG PO TBDP
4.0000 mg | ORAL_TABLET | Freq: Once | ORAL | Status: AC
  Administered 2022-07-02: 4 mg via ORAL
  Filled 2022-07-02: qty 1

## 2022-07-02 MED ORDER — DICYCLOMINE HCL 20 MG PO TABS: 20.0000 mg | ORAL_TABLET | Freq: Two times a day (BID) | ORAL | 0 refills | Status: AC

## 2022-07-02 NOTE — Discharge Instructions (Signed)
Please read and follow all provided instructions.  Your diagnoses today include:  1. Generalized abdominal pain   2. Acute cystitis without hematuria     Tests performed today include: Blood cell counts and platelets Kidney and liver function tests Pancreas function test (called lipase) Urine test to look for infection: does have signs of infection A blood or urine test for pregnancy (women only) Vital signs. See below for your results today.   Medications prescribed:  Macrobid - antibiotic for urinary tract infection  You have been prescribed an antibiotic medicine: take the entire course of medicine even if you are feeling better. Stopping early can cause the antibiotic not to work.  Bentyl - medication for intestinal cramps and spasms  Zofran (ondansetron) - for nausea and vomiting  Take any prescribed medications only as directed.  Home care instructions:  Follow any educational materials contained in this packet.  Follow-up instructions: Please follow-up with your primary care provider in the next 3 days for further evaluation of your symptoms.    Return instructions:  SEEK IMMEDIATE MEDICAL ATTENTION IF: The pain does not go away or becomes severe  A temperature above 101F develops  Repeated vomiting occurs (multiple episodes)  The pain becomes localized to portions of the abdomen. The right side could possibly be appendicitis. In an adult, the left lower portion of the abdomen could be colitis or diverticulitis.  Blood is being passed in stools or vomit (bright red or black tarry stools)  You develop chest pain, difficulty breathing, dizziness or fainting, or become confused, poorly responsive, or inconsolable (young children) If you have any other emergent concerns regarding your health  Additional Information: Abdominal (belly) pain can be caused by many things. Your caregiver performed an examination and possibly ordered blood/urine tests and imaging (CT scan,  x-rays, ultrasound). Many cases can be observed and treated at home after initial evaluation in the emergency department. Even though you are being discharged home, abdominal pain can be unpredictable. Therefore, you need a repeated exam if your pain does not resolve, returns, or worsens. Most patients with abdominal pain don't have to be admitted to the hospital or have surgery, but serious problems like appendicitis and gallbladder attacks can start out as nonspecific pain. Many abdominal conditions cannot be diagnosed in one visit, so follow-up evaluations are very important.  Your vital signs today were: BP (!) 139/106 (BP Location: Left Arm)   Pulse 79   Temp 98 F (36.7 C) (Oral)   Resp 18   SpO2 100%  If your blood pressure (bp) was elevated above 135/85 this visit, please have this repeated by your doctor within one month. --------------

## 2022-07-02 NOTE — ED Provider Triage Note (Signed)
Emergency Medicine Provider Triage Evaluation Note  Vickie Ford , a 42 y.o. female  was evaluated in triage.  Pt complains of abdominal pain described as cramping all over x 1 week, she is having belching and diarrhea.  It initially improved but now feels worse.  She is complaining of some aching in her legs and is worried she is dehydrated.  She states she was vomiting yesterday as well is also unable to take her Percocet for chronic pain due to the nausea and vomiting.  Review of Systems  Positive: Abdominal pain, nausea, vomiting,diarrhea  Negative: Fever, chest pain, sob, uri symptoms, cough  Physical Exam  BP (!) 132/99 (BP Location: Left Wrist)   Pulse 84   Temp 98.4 F (36.9 C) (Oral)   Resp 16   SpO2 100%  Gen:   Awake, no distress   Resp:  Normal effort  MSK:   Moves extremities without difficulty  Other:  Abdomen is soft and nontender with no gaurding or rigidity  Medical Decision Making  Medically screening exam initiated at 12:22 PM.  Appropriate orders placed.  Vickie Ford was informed that the remainder of the evaluation will be completed by another provider, this initial triage assessment does not replace that evaluation, and the importance of remaining in the ED until their evaluation is complete.     Ma Rings, [place] 07/02/22 1233

## 2022-07-02 NOTE — ED Provider Notes (Signed)
[facility] DEPT Provider Note   CSN: [id number] Arrival date & time: 07/02/22  1201     History  Chief Complaint  Patient presents with   Abdominal Pain    Vickie Ford is a 41 y.o. female.  Patient with history of cesarean section, no other abdominal surgeries, history of chronic pain --presents to the emergency department today for evaluation of intermittent abdominal cramping, occasional vomiting, and diarrhea over the past 1 week.  She denies fever, chest pain, shortness of breath.  No urinary symptoms.  No blood in the stool.  He states that symptoms started last week after eating brisket.  Denies other travel or suspicious food or water exposures.  No known sick contacts.  Main concern is that she feels generally weak and wonders if she could be dehydrated.  She does have a history of diabetes and high blood pressure as well.      Home Medications Prior to Admission medications   Medication Sig Start Date End Date Taking? Authorizing Provider  amLODipine (NORVASC) 5 MG tablet Take 5 mg by mouth daily. 10/03/21   [provider]  atorvastatin (LIPITOR) 10 MG tablet Take 10 mg by mouth daily. 07/27/21   [provider]  D3-50 1.25 MG (50000 UT) capsule Take 50,000 Units by mouth once a week. 10/03/21   [provider]  dicyclomine (BENTYL) 20 MG tablet Take 1 tablet (20 mg total) by mouth 2 (two) times daily. 09/30/21   Loni Beckwith, PA-C  hydrochlorothiazide (HYDRODIURIL) 25 MG tablet Take 25 mg by mouth daily. 07/30/21   [provider]  hydrocortisone (ANUSOL-HC) 2.5 % rectal cream Place 1 application. rectally 2 (two) times daily as needed for hemorrhoids. 10/10/21   Vladimir Crofts, PA-C  labetalol (NORMODYNE) 200 MG tablet Take 1 tablet (200 mg total) by mouth 2 (two) times daily. 06/29/19   Jorje Guild, NP  metroNIDAZOLE (FLAGYL) 500 MG tablet Take 500 mg by mouth 3 (three) times daily. 06/11/21    [provider]  ondansetron (ZOFRAN-ODT) 4 MG disintegrating tablet Take 1 tablet (4 mg total) by mouth every 8 (eight) hours as needed for nausea or vomiting. 09/30/21   Loni Beckwith, PA-C  oxyCODONE-acetaminophen (PERCOCET/ROXICET) 5-325 MG tablet Take 1 tablet by mouth 3 (three) times daily as needed. 10/06/21   [provider]  OZEMPIC, 0.25 OR 0.5 MG/DOSE, 2 MG/1.5ML SOPN Inject into the skin. 10/09/21   [provider]  pregabalin (LYRICA) 50 MG capsule Take 50 mg by mouth 2 (two) times daily. 08/14/21   [provider]  promethazine (PHENERGAN) 25 MG tablet Take 0.5-1 tablets (12.5-25 mg total) by mouth every 6 (six) hours as needed for nausea or vomiting. 07/05/19   Julianne Handler, CNM      Allergies    Penicillin g and Penicillins    Review of Systems   Review of Systems  Physical Exam Updated Vital Signs BP (!) 139/106 (BP Location: Left Arm)   Pulse 79   Temp 98 F (36.7 C) (Oral)   Resp 18   SpO2 100%   Physical Exam Vitals and nursing note reviewed.  Constitutional:      General: She is not in acute distress.    Appearance: She is well-developed.  HENT:     Head: Normocephalic and atraumatic.     Right Ear: External ear normal.     Left Ear: External ear normal.     Nose: Nose normal.  Mouth/Throat:     Mouth: Mucous membranes are moist.  Eyes:     Conjunctiva/sclera: Conjunctivae normal.  Cardiovascular:     Rate and Rhythm: Normal rate and regular rhythm.     Heart sounds: No murmur heard. Pulmonary:     Effort: No respiratory distress.     Breath sounds: No wheezing, rhonchi or rales.  Abdominal:     Palpations: Abdomen is soft.     Tenderness: There is abdominal tenderness (minimal generalized). There is no guarding or rebound. Negative signs include Murphy's sign and McBurney's sign.  Musculoskeletal:     Cervical back: Normal range of motion and neck supple.     Right lower leg: No edema.     Left  lower leg: No edema.  Skin:    General: Skin is warm and dry.     Findings: No rash.  Neurological:     General: No focal deficit present.     Mental Status: She is alert. Mental status is at baseline.     Motor: No weakness.  Psychiatric:        Mood and Affect: Mood normal.     ED Results / Procedures / Treatments   Labs (all labs ordered are listed, but only abnormal results are displayed) Labs Reviewed  CBC WITH DIFFERENTIAL/PLATELET - Abnormal; Notable for the following components:      Result Value   Hemoglobin 11.5 (*)    HCT 35.6 (*)    All other components within normal limits  URINALYSIS, ROUTINE W REFLEX MICROSCOPIC - Abnormal; Notable for the following components:   Color, Urine YELLOW (*)    APPearance CLOUDY (*)    Hgb urine dipstick LARGE (*)    Protein, ur 100 (*)    Leukocytes,Ua SMALL (*)    RBC / HPF >50 (*)    WBC, UA >50 (*)    Bacteria, UA RARE (*)    All other components within normal limits  COMPREHENSIVE METABOLIC PANEL  LIPASE, BLOOD  I-STAT BETA HCG BLOOD, ED ([facility], [facility], [facility] ONLY)    EKG None  Radiology No results found.  Procedures Procedures    Medications Ordered in ED Medications  ondansetron (ZOFRAN-ODT) disintegrating tablet 4 mg (4 mg Oral Given 07/02/22 1421)    ED Course/ Medical Decision Making/ A&P    Patient seen and examined. History obtained directly from patient. Work-up including labs, imaging, EKG ordered in triage, if performed, were reviewed.    Labs/EKG: Independently reviewed and interpreted.  This included: CBC with normal white blood cell count, mild anemia at 11.5 otherwise unremarkable; CMP unremarkable with normal blood sugar, normal transaminases; lipase normal.  UA pending.  Imaging: None ordered  Medications/Fluids: Ordered: ODT Zofran given at triage, patient currently p.o. challenge  Most recent vital signs reviewed and are as follows: BP (!) 139/106 (BP Location: Left Arm)   Pulse 79   Temp 98 F  (36.7 C) (Oral)   Resp 18   SpO2 100%   Initial impression: Generalized abdominal cramping with diarrhea and occasional vomiting.  Patient looks well with reassuring exam.  Attempting p.o. challenge, awaiting UA.  5:11 PM Reassessment performed. Patient appears stable. Exam is unchanged.  She appears comfortable.  She has tolerated p.o.'s.  Labs personally reviewed and interpreted including: UA not a clean-catch but does have greater than 50 white cells, greater than 50 red blood cells, small leuk esterase, white blood cell clumps, crystals  Reviewed pertinent lab work and imaging with patient at bedside.  Questions answered.   Most current vital signs reviewed and are as follows: BP (!) 139/106 (BP Location: Left Arm)   Pulse 79   Temp 98 F (36.7 C) (Oral)   Resp 18   SpO2 100%   Plan: Discharge to home.   Prescriptions written for: Macrobid, Zofran, Bentyl  Other home care instructions discussed: [place]  ED return instructions discussed: The patient was urged to return to the Emergency Department immediately with worsening of current symptoms, worsening abdominal pain, persistent vomiting, blood noted in stools, fever, or any other concerns. The patient verbalized understanding.   Follow-up instructions discussed: Patient encouraged to follow-up with their PCP in 3 days.                          Medical Decision Making Risk Prescription drug management.   For this patient's complaint of abdominal pain, the following conditions were considered on the differential diagnosis: gastritis/PUD, enteritis/duodenitis, appendicitis, cholelithiasis/cholecystitis, cholangitis, pancreatitis, ruptured viscus, colitis, diverticulitis, small/large bowel obstruction, proctitis, cystitis, pyelonephritis, ureteral colic, aortic dissection, aortic aneurysm. In women, ectopic pregnancy, pelvic inflammatory disease, ovarian cysts, and tubo-ovarian abscess were also considered. Atypical chest  etiologies were also considered including ACS, PE, and pneumonia.   Abdominal exam is reassuring.  Possible cystitis, but doubt pyelonephritis at this time.  The patient's vital signs, pertinent lab work and imaging were reviewed and interpreted as discussed in the ED course. Hospitalization was considered for further testing, treatments, or serial exams/observation. However as patient is well-appearing, has a stable exam, and reassuring studies today, I do not feel that they warrant admission at this time. This plan was discussed with the patient who verbalizes agreement and comfort with this plan and seems reliable and able to return to the Emergency Department with worsening or changing symptoms.          Final Clinical Impression(s) / ED Diagnoses Final diagnoses:  Generalized abdominal pain  Acute cystitis without hematuria    Rx / DC Orders ED Discharge Orders          Ordered    nitrofurantoin, macrocrystal-monohydrate, (MACROBID) 100 MG capsule  2 times daily        07/02/22 1705    dicyclomine (BENTYL) 20 MG tablet  2 times daily        07/02/22 1705    ondansetron (ZOFRAN-ODT) 4 MG disintegrating tablet  Every 8 hours PRN        07/02/22 1705              Carlisle Cater, PA-C 07/02/22 1713    Kommor, Debe Coder, MD 07/02/22 2144

## 2022-07-02 NOTE — ED Triage Notes (Signed)
Pt been sick since last week after some meat. Pt is c/o abdominal pain with vomiting and diarrhea.

## 2022-07-03 ENCOUNTER — Telehealth: Payer: Self-pay

## 2022-07-03 NOTE — Patient Outreach (Signed)
Transition Care Management Follow-up Telephone Call Date of discharge and from where: 07/02/22 [facility] How have you been since you were released from the hospital? Kind of the same Any questions or concerns? Yes  Items Reviewed: Did the pt receive and understand the discharge instructions provided? Yes  Medications obtained and verified? Yes  Other? No  Any new allergies since your discharge? No  Dietary orders reviewed? No Do you have support at home? Yes   Home Care and Equipment/Supplies: Were home health services ordered? not applicable If so, what is the name of the agency?   Has the agency set up a time to come to the patient's home? not applicable Were any new equipment or medical supplies ordered?  No What is the name of the medical supply agency?  Were you able to get the supplies/equipment? not applicable Do you have any questions related to the use of the equipment or supplies? No  Functional Questionnaire: (I = Independent and D = Dependent) ADLs: I  Bathing/Dressing- I  Meal Prep- I  Eating- I  Maintaining continence- I  Transferring/Ambulation- I  Managing Meds- I  Follow up appointments reviewed:  PCP Hospital f/u appt confirmed? No  Scheduled to see  on  @ . Specialist Hospital f/u appt confirmed? No  Scheduled to see  on  @ . Are transportation arrangements needed? No  If their condition worsens, is the pt aware to call PCP or go to the Emergency Dept.? Yes Was the patient provided with contact information for the PCP's office or ED? Yes Was to pt encouraged to call back with questions or concerns? Yes .AJS

## 2022-07-30 DIAGNOSIS — Z79899 Other long term (current) drug therapy: Secondary | ICD-10-CM | POA: Diagnosis not present

## 2022-07-30 DIAGNOSIS — M546 Pain in thoracic spine: Secondary | ICD-10-CM | POA: Diagnosis not present

## 2022-07-30 DIAGNOSIS — F339 Major depressive disorder, recurrent, unspecified: Secondary | ICD-10-CM | POA: Diagnosis not present

## 2022-07-30 DIAGNOSIS — E7800 Pure hypercholesterolemia, unspecified: Secondary | ICD-10-CM | POA: Diagnosis not present

## 2022-07-30 DIAGNOSIS — E559 Vitamin D deficiency, unspecified: Secondary | ICD-10-CM | POA: Diagnosis not present

## 2022-07-30 DIAGNOSIS — F5081 Binge eating disorder: Secondary | ICD-10-CM | POA: Diagnosis not present

## 2022-07-30 DIAGNOSIS — I10 Essential (primary) hypertension: Secondary | ICD-10-CM | POA: Diagnosis not present

## 2022-07-30 DIAGNOSIS — R7302 Impaired glucose tolerance (oral): Secondary | ICD-10-CM | POA: Diagnosis not present

## 2022-07-30 DIAGNOSIS — Z6841 Body Mass Index (BMI) 40.0 and over, adult: Secondary | ICD-10-CM | POA: Diagnosis not present

## 2022-08-04 DIAGNOSIS — Z79899 Other long term (current) drug therapy: Secondary | ICD-10-CM | POA: Diagnosis not present

## 2022-09-30 ENCOUNTER — Other Ambulatory Visit: Payer: Self-pay

## 2022-09-30 ENCOUNTER — Emergency Department (HOSPITAL_COMMUNITY)
Admission: EM | Admit: 2022-09-30 | Discharge: 2022-10-01 | Disposition: A | Discharge status: Home / Self Care | Payer: BC Managed Care – PPO | Attending: Emergency Medicine | Admitting: Emergency Medicine

## 2022-09-30 DIAGNOSIS — Z79899 Other long term (current) drug therapy: Secondary | ICD-10-CM | POA: Diagnosis not present

## 2022-09-30 DIAGNOSIS — H1131 Conjunctival hemorrhage, right eye: Secondary | ICD-10-CM | POA: Diagnosis not present

## 2022-09-30 DIAGNOSIS — I10 Essential (primary) hypertension: Secondary | ICD-10-CM | POA: Diagnosis not present

## 2022-09-30 DIAGNOSIS — H5789 Other specified disorders of eye and adnexa: Secondary | ICD-10-CM | POA: Diagnosis present

## 2022-09-30 NOTE — ED Triage Notes (Signed)
Pt states she went to bed and woke with blood vessels ruptured in right eye.  Hx of high BP and is unsure of this is the cause.  Pt states she missed her meds yesterday but did take today. Normally under control with medications.

## 2022-10-01 NOTE — Discharge Instructions (Addendum)
Continue your daily prescribed blood pressure medications.  Follow-up with your primary care doctor for recheck, as desired.

## 2022-10-01 NOTE — ED Provider Notes (Signed)
[facility] Provider Note   CSN: [id number] Arrival date & time: 09/30/22  2332     History  Chief Complaint  Patient presents with   Eye Problem    Vickie Ford is a 42 y.o. female.  42 year old female presents to the emergency department for evaluation of redness to her right eye.  States that she went to bed in her normal state of health and awoke in the morning to notice that her right eye was red.  She has a history of medication noncompliance; did not take her blood pressure medication yesterday.  Did take her medications today as prescribed.  She has not had any vision loss, eye pain, fevers, history of known trauma or bleeding disorders.  Not on chronic anticoagulation.  The history is provided by the patient. No language interpreter was used.  Eye Problem      Home Medications Prior to Admission medications   Medication Sig Start Date End Date Taking? Authorizing Provider  amLODipine (NORVASC) 5 MG tablet Take 5 mg by mouth daily. 10/03/21   [provider]  atorvastatin (LIPITOR) 10 MG tablet Take 10 mg by mouth daily. 07/27/21   [provider]  D3-50 1.25 MG (50000 UT) capsule Take 50,000 Units by mouth once a week. 10/03/21   [provider]  dicyclomine (BENTYL) 20 MG tablet Take 1 tablet (20 mg total) by mouth 2 (two) times daily. 07/02/22   Carlisle Cater, PA-C  hydrochlorothiazide (HYDRODIURIL) 25 MG tablet Take 25 mg by mouth daily. 07/30/21   [provider]  hydrocortisone (ANUSOL-HC) 2.5 % rectal cream Place 1 application. rectally 2 (two) times daily as needed for hemorrhoids. 10/10/21   Vladimir Crofts, PA-C  labetalol (NORMODYNE) 200 MG tablet Take 1 tablet (200 mg total) by mouth 2 (two) times daily. 06/29/19   Jorje Guild, NP  metroNIDAZOLE (FLAGYL) 500 MG tablet Take 500 mg by mouth 3 (three) times daily. 06/11/21   [provider]  nitrofurantoin,  macrocrystal-monohydrate, (MACROBID) 100 MG capsule Take 1 capsule (100 mg total) by mouth 2 (two) times daily. 07/02/22   Carlisle Cater, PA-C  ondansetron (ZOFRAN-ODT) 4 MG disintegrating tablet Take 1 tablet (4 mg total) by mouth every 8 (eight) hours as needed for nausea or vomiting. 07/02/22   Carlisle Cater, PA-C  oxyCODONE-acetaminophen (PERCOCET/ROXICET) 5-325 MG tablet Take 1 tablet by mouth 3 (three) times daily as needed. 10/06/21   [provider]  OZEMPIC, 0.25 OR 0.5 MG/DOSE, 2 MG/1.5ML SOPN Inject into the skin. 10/09/21   [provider]  pregabalin (LYRICA) 50 MG capsule Take 50 mg by mouth 2 (two) times daily. 08/14/21   [provider]  promethazine (PHENERGAN) 25 MG tablet Take 0.5-1 tablets (12.5-25 mg total) by mouth every 6 (six) hours as needed for nausea or vomiting. 07/05/19   Julianne Handler, CNM      Allergies    Penicillin g and Penicillins    Review of Systems   Review of Systems Ten systems reviewed and are negative for acute change, except as noted in the HPI.    Physical Exam Updated Vital Signs BP (!) 159/87 (BP Location: Left Arm)   Pulse 80   Temp 97.8 F (36.6 C) (Oral)   Resp 18   SpO2 100%  Physical Exam Vitals and nursing note reviewed.  Constitutional:      General: She is not in acute distress.    Appearance: She is well-developed. She is  not diaphoretic.     Comments: Nontoxic appearing and in NAD.  Obese African-American female.  HENT:     Head: Normocephalic and atraumatic.     Comments: No tenderness over temporal artery bilaterally. Eyes:     General: No scleral icterus.    Extraocular Movements: Extraocular movements intact.     Pupils: Pupils are equal, round, and reactive to light.     Comments: Subconjunctival hemorrhage noted to the lateral aspect of the right eye.  EOMs intact.  No drainage or tearing from the eye.  Pulmonary:     Effort: Pulmonary effort is normal. No respiratory distress.      Comments: Respirations even and unlabored Musculoskeletal:        General: Normal range of motion.     Cervical back: Normal range of motion.  Skin:    General: Skin is warm and dry.     Coloration: Skin is not pale.     Findings: No erythema or rash.  Neurological:     Mental Status: She is alert and oriented to person, place, and time.  Psychiatric:        Behavior: Behavior normal.     ED Results / Procedures / Treatments   Labs (all labs ordered are listed, but only abnormal results are displayed) Labs Reviewed - No data to display  EKG None  Radiology No results found.  Procedures Procedures    Medications Ordered in ED Medications - No data to display  ED Course/ Medical Decision Making/ A&P                             Medical Decision Making  This patient presents to the ED for concern of eye redness, this involves an extensive number of treatment options, and is a complaint that carries with it a high risk of complications and morbidity.  The differential diagnosis includes conjunctivitis vs acute glaucoma vs globe rupture vs subconjunctival hemorrhage   Co morbidities that complicate the patient evaluation  Obesity HTN   Additional history obtained:  External records from outside source obtained and reviewed including historical BP values; systolic traditionally [id number] at baseline.   Cardiac Monitoring:  The patient was maintained on a cardiac monitor.  I personally viewed and interpreted the cardiac monitored which showed an underlying rhythm of: NSR   Medicines ordered and prescription drug management:  I have reviewed the patients home medicines and have made adjustments as needed   Test Considered:  CBC to check platelets   Reevaluation:  After the interventions noted above, I reevaluated the patient and found that they have :stayed the same   Social Determinants of Health:  Hx of medication  noncompliance   Dispostion:  After consideration of the diagnostic results and the patients response to treatment, I feel that the patent would benefit from outpatient supportive care of spontaneous/atraumatic subconjunctival hemorrhage. No focal TTP over temporal artery. No associated eye pain to suspect glaucoma. No associated conjunctival injection, tearing/discharge or periorbital edema to suggest associated infection. Return precautions discussed and provided. Patient discharged in stable condition with no unaddressed concerns.          Final Clinical Impression(s) / ED Diagnoses Final diagnoses:  Subconjunctival hemorrhage of right eye    Rx / DC Orders ED Discharge Orders     None         Antonietta Breach, PA-C 10/01/22 0923    Margette Fast, MD 10/01/22  0602  

## 2022-10-02 ENCOUNTER — Telehealth: Payer: Self-pay | Admitting: *Deleted

## 2022-10-02 NOTE — Transitions of Care (Post Inpatient/ED Visit) (Signed)
   10/02/2022  Name: Vickie Ford MRN: [id number] DOB: 04-Apr-1981  Today's TOC FU Call Status: Today's TOC FU Call Status:: Successful TOC FU Call Competed TOC FU Call Complete Date: 10/02/22  Transition Care Management Follow-up Telephone Call Date of Discharge: 10/01/22 Discharge Facility: [facility]) Type of Discharge: Emergency Department Reason for ED Visit: Other: (eye problem) How have you been since you were released from the hospital?: Same Any questions or concerns?: No  Items Reviewed: Did you receive and understand the discharge instructions provided?: Yes Medications obtained and verified?: Yes (Medications Reviewed) Any new allergies since your discharge?: No Dietary orders reviewed?: NA Do you have support at home?: Yes People in Home: significant other Name of Support/Comfort Primary Source: [facility] and Equipment/Supplies: [place] Ordered?: NA Any new equipment or medical supplies ordered?: NA  Functional Questionnaire: Do you need assistance with bathing/showering or dressing?: No Do you need assistance with meal preparation?: No Do you need assistance with eating?: No Do you have difficulty maintaining continence: No Do you need assistance with getting out of bed/getting out of a chair/moving?: No Do you have difficulty managing or taking your medications?: No  Folllow up appointments reviewed: PCP Follow-up appointment confirmed?: NA Specialist Hospital Follow-up appointment confirmed?: NA Do you need transportation to your follow-up appointment?: No Do you understand care options if your condition(s) worsen?: Yes-patient verbalized understanding  SDOH Interventions Today    Flowsheet Row Most Recent Value  SDOH Interventions   Transportation Interventions Intervention Not Indicated      The patient was given information about care management services as a benefit of their Medicaid health plan today.    Patient                                              did not agree to enrollment in care management services and does not wish to consider at this time.  RNCM provided patient with MD Referral line [phone] as requested, to establish care with new PCP.  Lurena Joiner RN, BSN [place]  [employer] manager

## 2022-11-03 ENCOUNTER — Telehealth: Payer: Self-pay | Admitting: Dermatology

## 2022-11-03 NOTE — Telephone Encounter (Signed)
Patient contacted to schedule apt. States she is being seen at [facility]. Patient advised to contact Medicaid to notify them of what PCP she is seeing to be attributed correctly. Patient verbalized understanding. AS, CMA

## 2022-12-31 DIAGNOSIS — Z6841 Body Mass Index (BMI) 40.0 and over, adult: Secondary | ICD-10-CM | POA: Diagnosis not present

## 2022-12-31 DIAGNOSIS — M545 Low back pain, unspecified: Secondary | ICD-10-CM | POA: Diagnosis not present

## 2022-12-31 DIAGNOSIS — G4710 Hypersomnia, unspecified: Secondary | ICD-10-CM | POA: Diagnosis not present

## 2022-12-31 DIAGNOSIS — I10 Essential (primary) hypertension: Secondary | ICD-10-CM | POA: Diagnosis not present

## 2022-12-31 DIAGNOSIS — R7303 Prediabetes: Secondary | ICD-10-CM | POA: Diagnosis not present

## 2022-12-31 DIAGNOSIS — F339 Major depressive disorder, recurrent, unspecified: Secondary | ICD-10-CM | POA: Diagnosis not present

## 2023-01-12 IMAGING — DX DG ANKLE COMPLETE 3+V*R*
3 series · 3 of 3 positions shown · non-contrast
Comparison: None.

CLINICAL DATA: Medial right ankle pain after injury 4 days ago

EXAM:
RIGHT ANKLE - COMPLETE 3+ VIEW

[ankle ap]
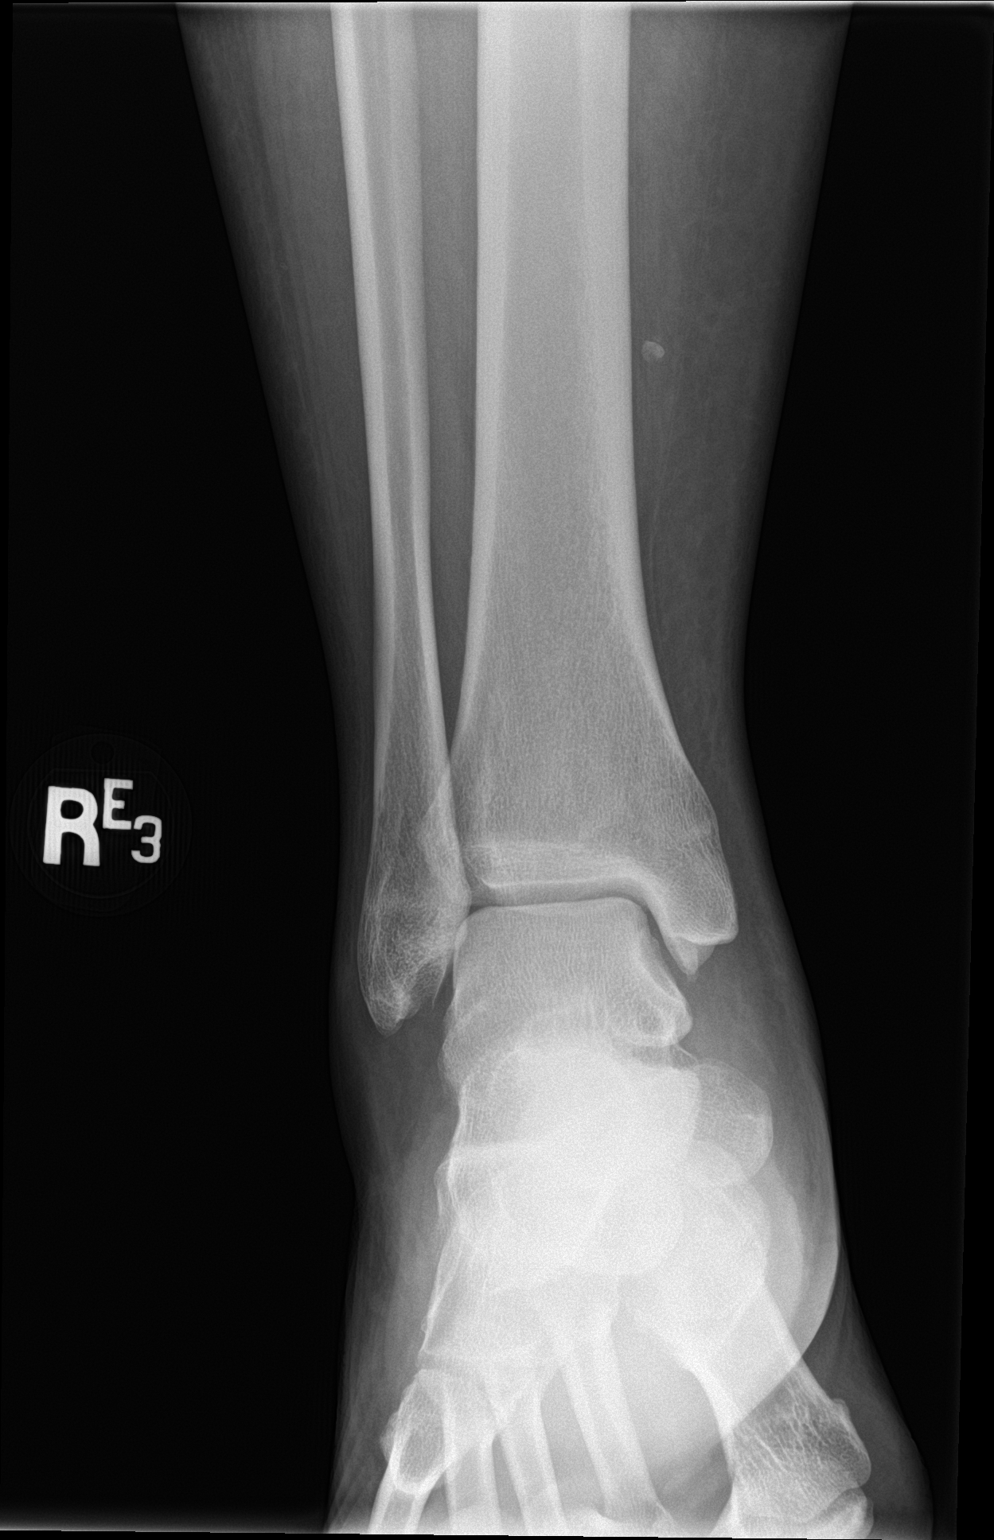

[ankle obl]
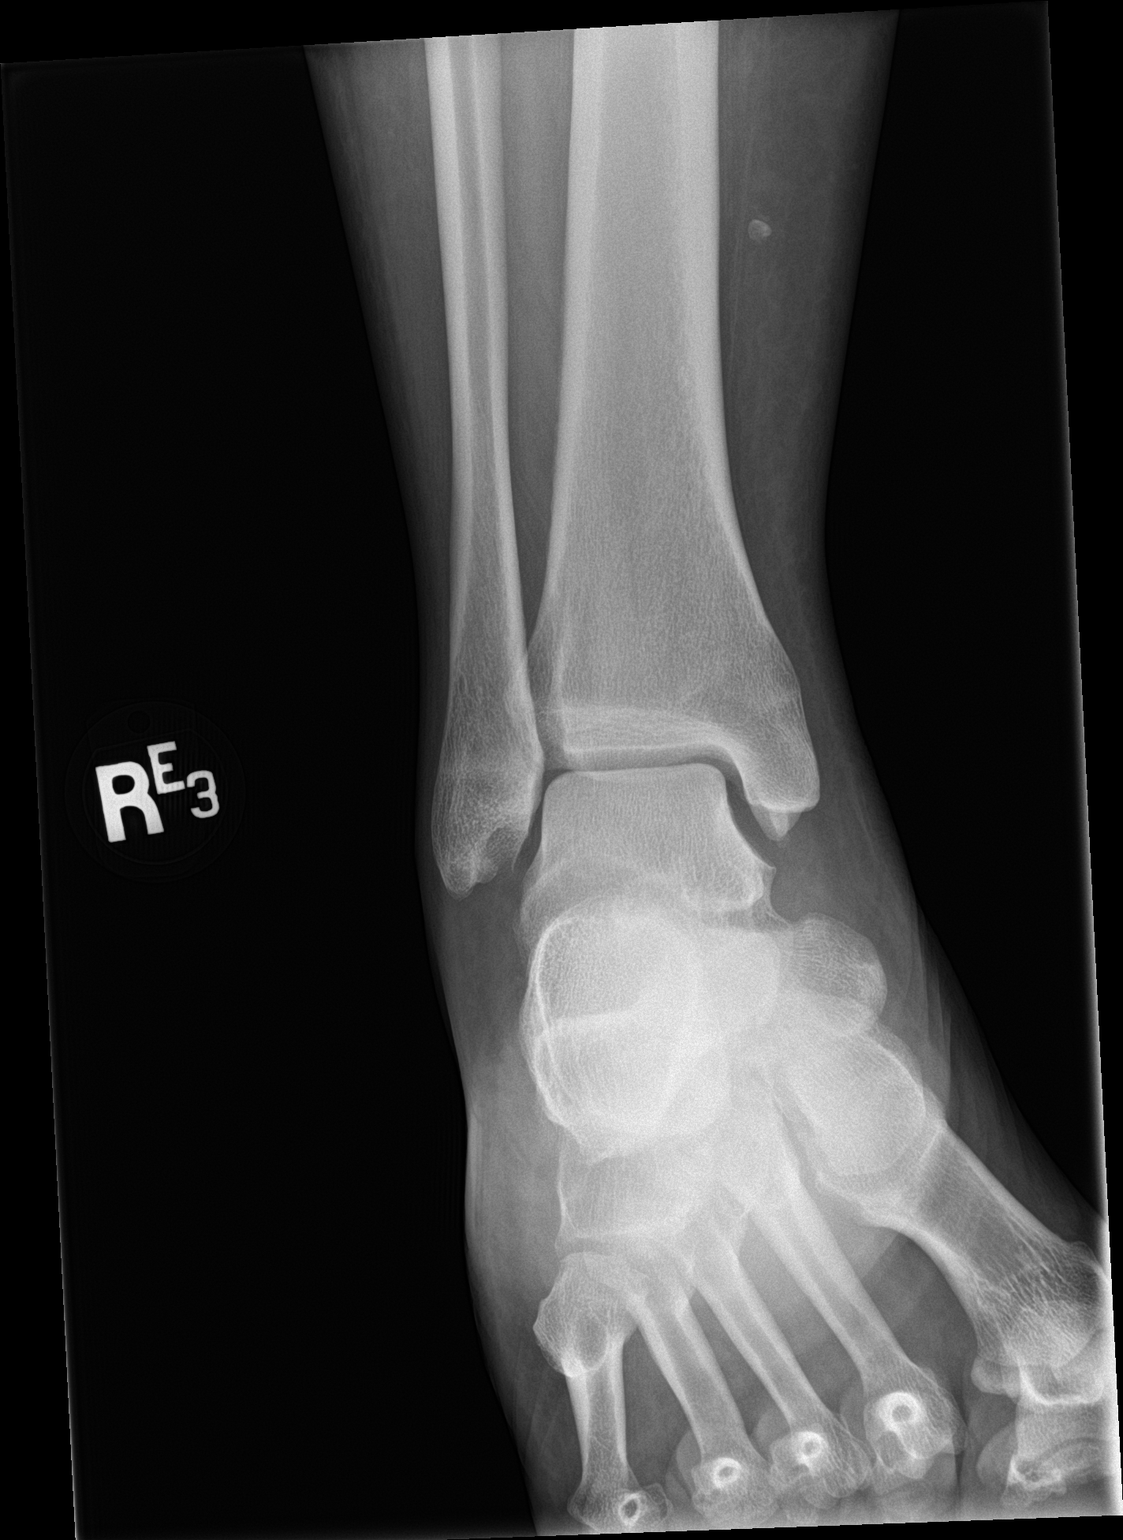

[ankle lat]
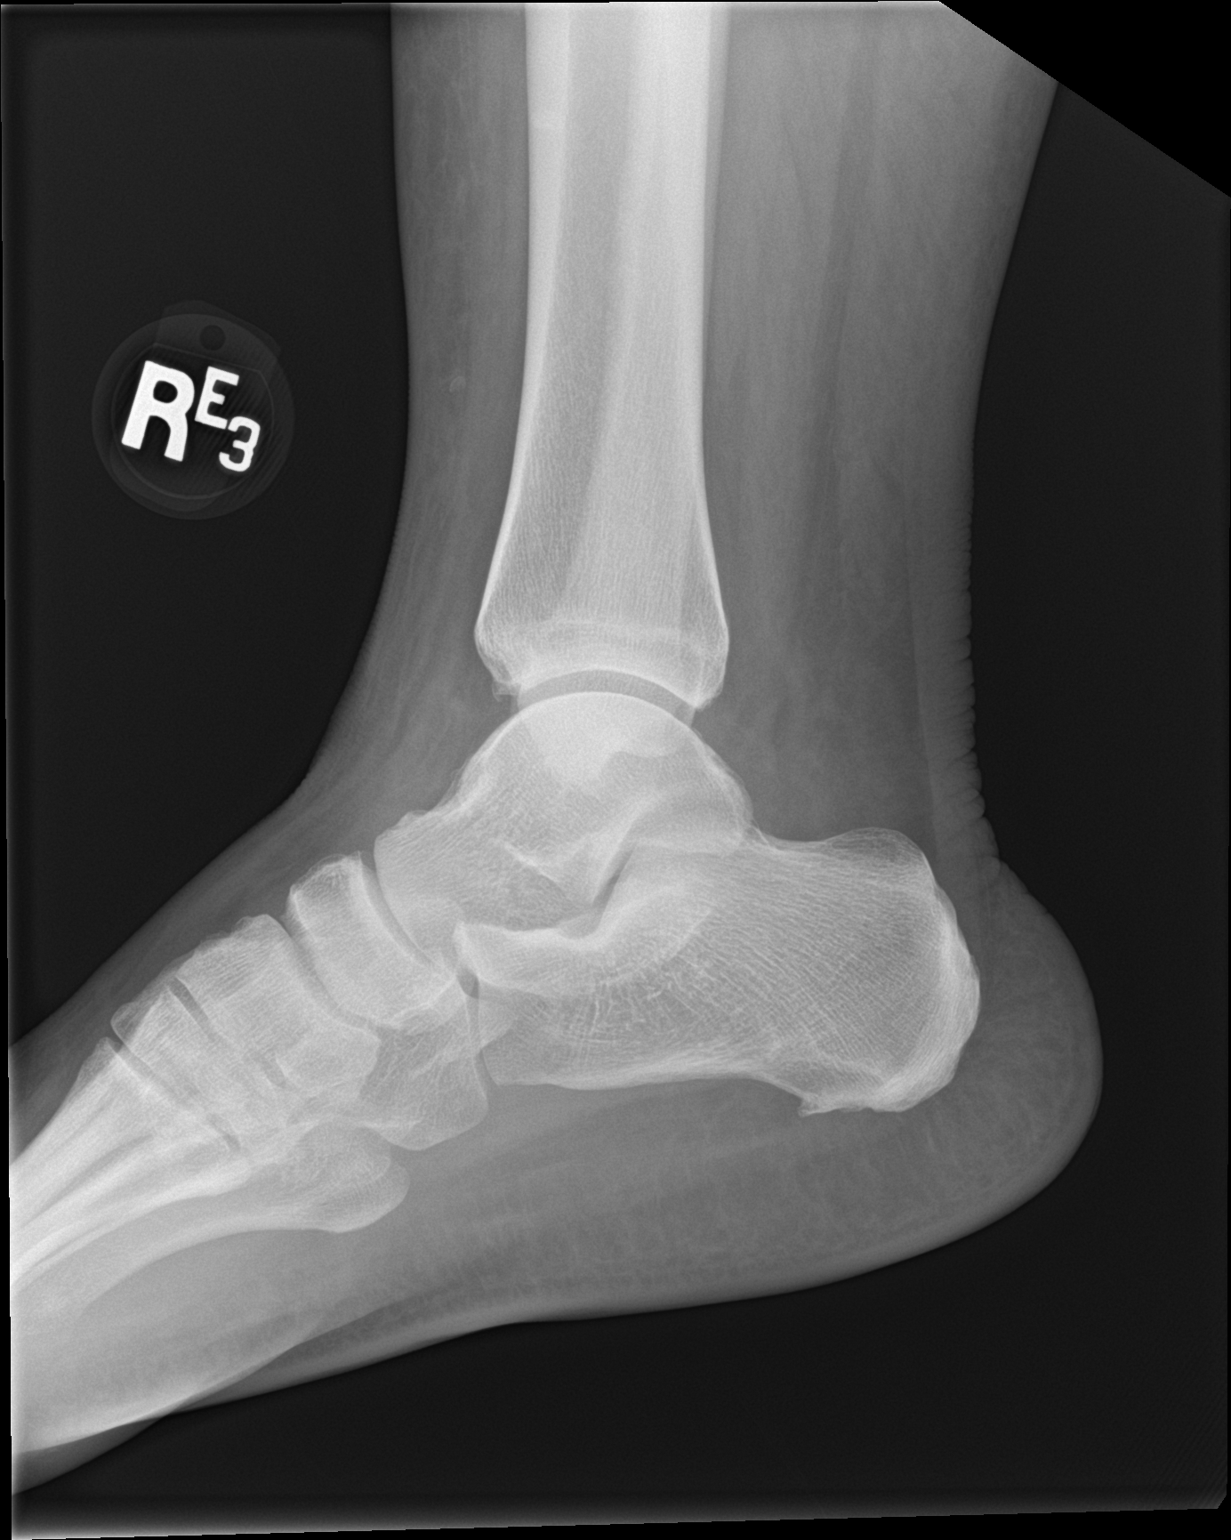

[3 of 3 positions shown; findings below may reference images not displayed]

FINDINGS: There is no evidence of fracture, dislocation, or joint effusion.
There is no evidence of arthropathy or other focal bone abnormality.
Soft tissues are unremarkable.
IMPRESSION: Negative.

## 2023-01-15 DIAGNOSIS — G4761 Periodic limb movement disorder: Secondary | ICD-10-CM | POA: Diagnosis not present

## 2023-01-15 DIAGNOSIS — G4710 Hypersomnia, unspecified: Secondary | ICD-10-CM | POA: Diagnosis not present

## 2023-01-15 DIAGNOSIS — G4733 Obstructive sleep apnea (adult) (pediatric): Secondary | ICD-10-CM | POA: Diagnosis not present

## 2023-01-15 DIAGNOSIS — G4736 Sleep related hypoventilation in conditions classified elsewhere: Secondary | ICD-10-CM | POA: Diagnosis not present

## 2023-01-15 DIAGNOSIS — G4752 REM sleep behavior disorder: Secondary | ICD-10-CM | POA: Diagnosis not present

## 2023-02-17 DIAGNOSIS — G4736 Sleep related hypoventilation in conditions classified elsewhere: Secondary | ICD-10-CM | POA: Diagnosis not present

## 2023-02-17 DIAGNOSIS — R0902 Hypoxemia: Secondary | ICD-10-CM | POA: Diagnosis not present

## 2023-02-17 DIAGNOSIS — G4733 Obstructive sleep apnea (adult) (pediatric): Secondary | ICD-10-CM | POA: Diagnosis not present

## 2023-02-17 DIAGNOSIS — G4761 Periodic limb movement disorder: Secondary | ICD-10-CM | POA: Diagnosis not present

## 2023-03-10 DIAGNOSIS — Z79899 Other long term (current) drug therapy: Secondary | ICD-10-CM | POA: Diagnosis not present

## 2023-03-11 DIAGNOSIS — I10 Essential (primary) hypertension: Secondary | ICD-10-CM | POA: Diagnosis not present

## 2023-03-11 DIAGNOSIS — Z6841 Body Mass Index (BMI) 40.0 and over, adult: Secondary | ICD-10-CM | POA: Diagnosis not present

## 2023-03-11 DIAGNOSIS — M546 Pain in thoracic spine: Secondary | ICD-10-CM | POA: Diagnosis not present

## 2023-03-11 DIAGNOSIS — G4733 Obstructive sleep apnea (adult) (pediatric): Secondary | ICD-10-CM | POA: Diagnosis not present

## 2023-03-12 DIAGNOSIS — Z79899 Other long term (current) drug therapy: Secondary | ICD-10-CM | POA: Diagnosis not present

## 2023-03-26 DIAGNOSIS — G4733 Obstructive sleep apnea (adult) (pediatric): Secondary | ICD-10-CM | POA: Diagnosis not present

## 2023-04-16 ENCOUNTER — Other Ambulatory Visit: Payer: Self-pay

## 2023-04-16 ENCOUNTER — Encounter (HOSPITAL_COMMUNITY): Payer: Self-pay

## 2023-04-16 ENCOUNTER — Emergency Department (HOSPITAL_COMMUNITY): Payer: Medicaid Other

## 2023-04-16 ENCOUNTER — Emergency Department (HOSPITAL_COMMUNITY)
Admission: EM | Admit: 2023-04-16 | Discharge: 2023-04-16 | Disposition: A | Discharge status: Home / Self Care | Payer: Medicaid Other | Attending: Emergency Medicine | Admitting: Emergency Medicine

## 2023-04-16 DIAGNOSIS — R9431 Abnormal electrocardiogram [ECG] [EKG]: Secondary | ICD-10-CM | POA: Diagnosis not present

## 2023-04-16 DIAGNOSIS — M549 Dorsalgia, unspecified: Secondary | ICD-10-CM | POA: Diagnosis not present

## 2023-04-16 DIAGNOSIS — I10 Essential (primary) hypertension: Secondary | ICD-10-CM | POA: Diagnosis not present

## 2023-04-16 DIAGNOSIS — M5416 Radiculopathy, lumbar region: Secondary | ICD-10-CM | POA: Diagnosis not present

## 2023-04-16 DIAGNOSIS — M545 Low back pain, unspecified: Secondary | ICD-10-CM | POA: Diagnosis present

## 2023-04-16 DIAGNOSIS — M47816 Spondylosis without myelopathy or radiculopathy, lumbar region: Secondary | ICD-10-CM | POA: Diagnosis not present

## 2023-04-16 DIAGNOSIS — R531 Weakness: Secondary | ICD-10-CM | POA: Diagnosis not present

## 2023-04-16 LAB — HCG, SERUM, QUALITATIVE: Preg, Serum: NEGATIVE

## 2023-04-16 LAB — CBC WITH DIFFERENTIAL/PLATELET
Abs Immature Granulocytes: 0.01 10*3/uL (ref 0.00–0.07)
Basophils Absolute: 0 10*3/uL (ref 0.0–0.1)
Basophils Relative: 1 %
Eosinophils Absolute: 0.2 10*3/uL (ref 0.0–0.5)
Eosinophils Relative: 2 %
HCT: 37.1 % (ref 36.0–46.0)
Hemoglobin: 12 g/dL (ref 12.0–15.0)
Immature Granulocytes: 0 %
Lymphocytes Relative: 36 %
Lymphs Abs: 2.2 10*3/uL (ref 0.7–4.0)
MCH: 28.8 pg (ref 26.0–34.0)
MCHC: 32.3 g/dL (ref 30.0–36.0)
MCV: 89 fL (ref 80.0–100.0)
Monocytes Absolute: 0.4 10*3/uL (ref 0.1–1.0)
Monocytes Relative: 6 %
Neutro Abs: 3.4 10*3/uL (ref 1.7–7.7)
Neutrophils Relative %: 55 %
Platelets: 365 10*3/uL (ref 150–400)
RBC: 4.17 MIL/uL (ref 3.87–5.11)
RDW: 13.1 % (ref 11.5–15.5)
WBC: 6.2 10*3/uL (ref 4.0–10.5)
nRBC: 0 % (ref 0.0–0.2)

## 2023-04-16 LAB — BASIC METABOLIC PANEL
Anion gap: 7 (ref 5–15)
BUN: 12 mg/dL (ref 6–20)
CO2: 26 mmol/L (ref 22–32)
Calcium: 8.8 mg/dL — ABNORMAL LOW (ref 8.9–10.3)
Chloride: 104 mmol/L (ref 98–111)
Creatinine, Ser: 0.73 mg/dL (ref 0.44–1.00)
GFR, Estimated: >60 mL/min (ref >60)
Glucose, Bld: 99 mg/dL (ref 70–99)
Potassium: 3.4 mmol/L — ABNORMAL LOW (ref 3.5–5.1)
Sodium: 137 mmol/L (ref 135–145)

## 2023-04-16 MED ORDER — DEXAMETHASONE SODIUM PHOSPHATE 10 MG/ML IJ SOLN
10.0000 mg | Freq: Once | INTRAMUSCULAR | Status: AC
Start: 2023-04-16 — End: 2023-04-16
  Administered 2023-04-16: 10 mg via INTRAVENOUS
  Filled 2023-04-16: qty 1

## 2023-04-16 MED ORDER — FENTANYL CITRATE PF 50 MCG/ML IJ SOSY
50.0000 ug | PREFILLED_SYRINGE | Freq: Once | INTRAMUSCULAR | Status: AC
  Administered 2023-04-16: 50 ug via INTRAVENOUS
  Filled 2023-04-16: qty 1

## 2023-04-16 NOTE — ED Triage Notes (Signed)
Pt arrive pov reporting fall 2 weeks. Denies LOC or head injury. Reports R leg pain. Endorses headache.  States feeling weak. Denies recent illness or exposure to illness. No N/V

## 2023-04-16 NOTE — Discharge Instructions (Signed)
As we discussed, the Decadron which is a steroid will start kicking in over night tonight and your back should get better with time.  Also like we discussed, I would like for you to trial not taking the other weight loss medication and if the tramadol is not working do not take it.  You may return to the emergency department for any worsening symptoms.

## 2023-04-16 NOTE — ED Provider Triage Note (Signed)
Emergency Medicine Provider Triage Evaluation Note  Vickie Ford , a 42 y.o. female  was evaluated in triage.  Pt complains of generalized weakness and right sided back pain that radiates down RLE. History of lumbar radiculopathy. Fell 2 weeks ago on her right side. Worse pain now. No saddle anesthesia, bowel/bladder incontinence, lower extremity numbness/tingling, lower extremity weakness.  No fever or chills.  Review of Systems  Positive: Weakness, back pain Negative: fever  Physical Exam  BP (!) 142/80 (BP Location: Left Arm)   Pulse 85   Temp 98.3 F (36.8 C) (Oral)   Resp 16   Ht 5\' 7"  (1.702 m)   Wt (!) 167.8 kg   SpO2 98%   BMI 57.94 kg/m  Gen:   Awake, no distress   Resp:  Normal effort  MSK:   Moves extremities without difficulty  Other:    Medical Decision Making  Medically screening exam initiated at 6:48 PM.  Appropriate orders placed.  Vickie Ford was informed that the remainder of the evaluation will be completed by another provider, this initial triage assessment does not replace that evaluation, and the importance of remaining in the ED until their evaluation is complete.  Weakness- routine labs X-ray lumbar spine   Mannie Stabile, PA-C 04/16/23 1849

## 2023-04-16 NOTE — ED Provider Notes (Signed)
[place] EMERGENCY DEPARTMENT AT [facility] Provider Note   CSN: [id number] Arrival date & time: 04/16/23  1743     History Chief Complaint  Patient presents with   Fall    Two weeks ago    Vickie Ford is a 42 y.o. female patient who presents to the emergency department for further evaluation of a fall with right sided back pain that radiates down the right leg.  Patient does suffer from chronic right-sided radiculopathy but when she fell 2 weeks ago it has been worse.  She has been taking her narcotic pain medication including tramadol with little improvement.  Patient also states that she recently started taking an additional weight loss medication.  She states that since the fall and this pain she has been not sleeping well and has been excessively more fatigued.  She denies any bowel or bladder incontinence.   Fall       Home Medications Prior to Admission medications   Medication Sig Start Date End Date Taking? Authorizing Provider  amLODipine (NORVASC) 5 MG tablet Take 5 mg by mouth daily. 10/03/21   [provider]  atorvastatin (LIPITOR) 10 MG tablet Take 10 mg by mouth daily. 07/27/21   [provider]  D3-50 1.25 MG (50000 UT) capsule Take 50,000 Units by mouth once a week. Patient not taking: Reported on 10/02/2022 10/03/21   [provider]  dicyclomine (BENTYL) 20 MG tablet Take 1 tablet (20 mg total) by mouth 2 (two) times daily. Patient not taking: Reported on 10/02/2022 07/02/22   Renne Crigler, PA-C  hydrochlorothiazide (HYDRODIURIL) 25 MG tablet Take 25 mg by mouth daily. 07/30/21   [provider]  hydrocortisone (ANUSOL-HC) 2.5 % rectal cream Place 1 application. rectally 2 (two) times daily as needed for hemorrhoids. Patient not taking: Reported on 10/02/2022 10/10/21   Doree Albee, PA-C  labetalol (NORMODYNE) 200 MG tablet Take 1 tablet (200 mg total) by mouth 2 (two) times daily. Patient not taking:  Reported on 10/02/2022 06/29/19   Judeth Horn, NP  metroNIDAZOLE (FLAGYL) 500 MG tablet Take 500 mg by mouth 3 (three) times daily. Patient not taking: Reported on 10/02/2022 06/11/21   [provider]  nitrofurantoin, macrocrystal-monohydrate, (MACROBID) 100 MG capsule Take 1 capsule (100 mg total) by mouth 2 (two) times daily. Patient not taking: Reported on 10/02/2022 07/02/22   Renne Crigler, PA-C  ondansetron (ZOFRAN-ODT) 4 MG disintegrating tablet Take 1 tablet (4 mg total) by mouth every 8 (eight) hours as needed for nausea or vomiting. Patient not taking: Reported on 10/02/2022 07/02/22   Renne Crigler, PA-C  oxyCODONE-acetaminophen (PERCOCET/ROXICET) 5-325 MG tablet Take 1 tablet by mouth 3 (three) times daily as needed. 10/06/21   [provider]  OZEMPIC, 0.25 OR 0.5 MG/DOSE, 2 MG/1.5ML SOPN Inject into the skin. Patient not taking: Reported on 10/02/2022 10/09/21   [provider]  pregabalin (LYRICA) 50 MG capsule Take 50 mg by mouth 2 (two) times daily. Patient not taking: Reported on 10/02/2022 08/14/21   [provider]  promethazine (PHENERGAN) 25 MG tablet Take 0.5-1 tablets (12.5-25 mg total) by mouth every 6 (six) hours as needed for nausea or vomiting. Patient not taking: Reported on 10/02/2022 07/05/19   Donette Larry, CNM      Allergies    Penicillin g and Penicillins    Review of Systems   Review of Systems  All other systems reviewed and are negative.   Physical Exam Updated Vital Signs BP Marland Kitchen)  142/80 (BP Location: Left Arm)   Pulse 85   Temp 98.3 F (36.8 C) (Oral)   Resp 16   Ht 5\' 7"  (1.702 m)   Wt (!) 167.8 kg   SpO2 98%   BMI 57.94 kg/m  Physical Exam Vitals and nursing note reviewed.  Constitutional:      Appearance: Normal appearance.  HENT:     Head: Normocephalic and atraumatic.  Eyes:     General:        Right eye: No discharge.        Left eye: No discharge.     Conjunctiva/sclera: Conjunctivae normal.   Pulmonary:     Effort: Pulmonary effort is normal.  Musculoskeletal:     Comments: There is lumbar midline tenderness.  No paralumbar muscular tenderness.  5/5 strength to lower extremities.  Normal sensation to the lower extremities.  Skin:    General: Skin is warm and dry.     Findings: No rash.  Neurological:     General: No focal deficit present.     Mental Status: She is alert.  Psychiatric:        Mood and Affect: Mood normal.        Behavior: Behavior normal.     ED Results / Procedures / Treatments   Labs (all labs ordered are listed, but only abnormal results are displayed) Labs Reviewed  BASIC METABOLIC PANEL - Abnormal; Notable for the following components:      Result Value   Potassium 3.4 (*)    Calcium 8.8 (*)    All other components within normal limits  CBC WITH DIFFERENTIAL/PLATELET  HCG, SERUM, QUALITATIVE    EKG EKG Interpretation Date/Time:  Thursday April 16 2023 18:06:09 EDT Ventricular Rate:  85 PR Interval:  157 QRS Duration:  96 QT Interval:  356 QTC Calculation: 424 R Axis:   47  Text Interpretation: Sinus rhythm Low voltage, precordial leads Confirmed by Benjiman Core [phone]) on 04/16/2023 9:53:15 PM  Radiology DG Lumbar Spine Complete  Result Date: 04/16/2023 CLINICAL DATA:  Generalized weakness and right-sided back pain that radiates down the right lower extremity. Fell 2 weeks onto the right side. EXAM: LUMBAR SPINE - COMPLETE 4+ VIEW COMPARISON:  CT abdomen and pelvis 09/20/2021 FINDINGS: No evidence of acute fracture or traumatic listhesis. Intervertebral disc space height is maintained. Mild lower lumbar facet arthropathy. IMPRESSION: No evidence of acute fracture or traumatic listhesis. Electronically Signed   By: Minerva Fester M.D.   On: 04/16/2023 21:55    Procedures Procedures    Medications Ordered in ED Medications  dexamethasone (DECADRON) injection 10 mg (10 mg Intravenous Given 04/16/23 2125)  fentaNYL  (SUBLIMAZE) injection 50 mcg (50 mcg Intravenous Given 04/16/23 2125)    ED Course/ Medical Decision Making/ A&P Clinical Course as of 04/16/23 2205  Thu Apr 16, 2023  2131 Basic metabolic panel(!) Mild hypokalemia.  [CF]  2131 CBC with Differential Normal.  [CF]  2157 hCG, serum, qualitative Negative. [CF]  2159 DG Lumbar Spine Complete I personally ordered and interpreted the study and do not see any evidence of fracture.  I do agree with the radiologist interpretation. [CF]    Clinical Course User Index [CF] Teressa Lower, PA-C   {   Click here for ABCD2, HEART and other calculators  Medical Decision Making ANNELLA SICARD is a 42 y.o. female patient who presents to the emergency department today for further evaluation of right-sided back pain with radiation to the right leg in  addition to fatigue.  Basic labs ordered in triage interpreted by myself which is highlighted in the ED course.  Imaging was done as well.  I suspect this is likely a acute exacerbation of a chronic problem.  Will plan to give her some pain medication here in addition to some steroids with plan to discharge as long as x-ray is normal.  I have a low suspicion for cauda equina, fracture, or epidural abscess at this time.  Imaging was normal.  Blood work is normal as highlighted in ED course.  Decadron given in addition to fentanyl.  Will treat conservatively at home. I am going to give her follow-up with pain management doctor.  Patient agreeable.  She is safe for discharge at this time.   Amount and/or Complexity of Data Reviewed Labs:  Decision-making details documented in ED Course. Radiology:  Decision-making details documented in ED Course.  Risk Prescription drug management.    Final Clinical Impression(s) / ED Diagnoses Final diagnoses:  Lumbar radiculopathy    Rx / DC Orders ED Discharge Orders     None         Jolyn Lent 04/16/23 2206    Benjiman Core,  MD 04/16/23 2317

## 2023-04-25 DIAGNOSIS — G4733 Obstructive sleep apnea (adult) (pediatric): Secondary | ICD-10-CM | POA: Diagnosis not present

## 2023-04-27 DIAGNOSIS — M545 Low back pain, unspecified: Secondary | ICD-10-CM | POA: Diagnosis not present

## 2023-05-12 DIAGNOSIS — G4733 Obstructive sleep apnea (adult) (pediatric): Secondary | ICD-10-CM | POA: Diagnosis not present

## 2023-05-12 DIAGNOSIS — I10 Essential (primary) hypertension: Secondary | ICD-10-CM | POA: Diagnosis not present

## 2023-05-12 DIAGNOSIS — M545 Low back pain, unspecified: Secondary | ICD-10-CM | POA: Diagnosis not present

## 2023-05-12 DIAGNOSIS — Z6841 Body Mass Index (BMI) 40.0 and over, adult: Secondary | ICD-10-CM | POA: Diagnosis not present

## 2023-05-26 DIAGNOSIS — G4733 Obstructive sleep apnea (adult) (pediatric): Secondary | ICD-10-CM | POA: Diagnosis not present

## 2023-07-12 DIAGNOSIS — N898 Other specified noninflammatory disorders of vagina: Secondary | ICD-10-CM | POA: Diagnosis not present

## 2023-07-12 DIAGNOSIS — I10 Essential (primary) hypertension: Secondary | ICD-10-CM | POA: Diagnosis not present

## 2023-07-12 DIAGNOSIS — Z6841 Body Mass Index (BMI) 40.0 and over, adult: Secondary | ICD-10-CM | POA: Diagnosis not present

## 2023-07-12 DIAGNOSIS — R9431 Abnormal electrocardiogram [ECG] [EKG]: Secondary | ICD-10-CM | POA: Diagnosis not present

## 2023-07-12 DIAGNOSIS — R7303 Prediabetes: Secondary | ICD-10-CM | POA: Diagnosis not present

## 2023-07-12 DIAGNOSIS — E785 Hyperlipidemia, unspecified: Secondary | ICD-10-CM | POA: Diagnosis not present

## 2023-07-12 DIAGNOSIS — E559 Vitamin D deficiency, unspecified: Secondary | ICD-10-CM | POA: Diagnosis not present

## 2023-07-12 DIAGNOSIS — R5383 Other fatigue: Secondary | ICD-10-CM | POA: Diagnosis not present

## 2023-07-12 DIAGNOSIS — Z7251 High risk heterosexual behavior: Secondary | ICD-10-CM | POA: Diagnosis not present

## 2023-07-12 DIAGNOSIS — R0602 Shortness of breath: Secondary | ICD-10-CM | POA: Diagnosis not present

## 2023-07-12 DIAGNOSIS — Z3200 Encounter for pregnancy test, result unknown: Secondary | ICD-10-CM | POA: Diagnosis not present

## 2023-07-12 DIAGNOSIS — N946 Dysmenorrhea, unspecified: Secondary | ICD-10-CM | POA: Diagnosis not present

## 2023-07-24 DIAGNOSIS — K644 Residual hemorrhoidal skin tags: Secondary | ICD-10-CM | POA: Diagnosis not present

## 2023-07-24 DIAGNOSIS — K625 Hemorrhage of anus and rectum: Secondary | ICD-10-CM | POA: Diagnosis not present

## 2023-08-06 DIAGNOSIS — M791 Myalgia, unspecified site: Secondary | ICD-10-CM | POA: Diagnosis not present

## 2023-08-06 DIAGNOSIS — J302 Other seasonal allergic rhinitis: Secondary | ICD-10-CM | POA: Diagnosis not present

## 2023-08-06 DIAGNOSIS — J069 Acute upper respiratory infection, unspecified: Secondary | ICD-10-CM | POA: Diagnosis not present

## 2023-08-06 DIAGNOSIS — J029 Acute pharyngitis, unspecified: Secondary | ICD-10-CM | POA: Diagnosis not present

## 2023-08-07 DIAGNOSIS — N920 Excessive and frequent menstruation with regular cycle: Secondary | ICD-10-CM | POA: Diagnosis not present

## 2023-08-07 DIAGNOSIS — N760 Acute vaginitis: Secondary | ICD-10-CM | POA: Diagnosis not present

## 2023-08-07 DIAGNOSIS — R32 Unspecified urinary incontinence: Secondary | ICD-10-CM | POA: Diagnosis not present

## 2023-08-07 DIAGNOSIS — Z113 Encounter for screening for infections with a predominantly sexual mode of transmission: Secondary | ICD-10-CM | POA: Diagnosis not present

## 2023-08-07 DIAGNOSIS — Z124 Encounter for screening for malignant neoplasm of cervix: Secondary | ICD-10-CM | POA: Diagnosis not present

## 2023-08-08 DIAGNOSIS — I10 Essential (primary) hypertension: Secondary | ICD-10-CM | POA: Diagnosis not present

## 2023-08-08 DIAGNOSIS — Z6841 Body Mass Index (BMI) 40.0 and over, adult: Secondary | ICD-10-CM | POA: Diagnosis not present

## 2023-08-08 DIAGNOSIS — Z3200 Encounter for pregnancy test, result unknown: Secondary | ICD-10-CM | POA: Diagnosis not present

## 2023-08-08 DIAGNOSIS — R110 Nausea: Secondary | ICD-10-CM | POA: Diagnosis not present

## 2023-08-21 DIAGNOSIS — M546 Pain in thoracic spine: Secondary | ICD-10-CM | POA: Diagnosis not present

## 2023-08-21 DIAGNOSIS — E559 Vitamin D deficiency, unspecified: Secondary | ICD-10-CM | POA: Diagnosis not present

## 2023-08-21 DIAGNOSIS — G8929 Other chronic pain: Secondary | ICD-10-CM | POA: Diagnosis not present

## 2023-08-21 DIAGNOSIS — Z3200 Encounter for pregnancy test, result unknown: Secondary | ICD-10-CM | POA: Diagnosis not present

## 2023-08-21 DIAGNOSIS — Z6841 Body Mass Index (BMI) 40.0 and over, adult: Secondary | ICD-10-CM | POA: Diagnosis not present

## 2023-08-21 DIAGNOSIS — Z79899 Other long term (current) drug therapy: Secondary | ICD-10-CM | POA: Diagnosis not present

## 2023-08-21 DIAGNOSIS — M545 Low back pain, unspecified: Secondary | ICD-10-CM | POA: Diagnosis not present

## 2023-08-21 DIAGNOSIS — Z9189 Other specified personal risk factors, not elsewhere classified: Secondary | ICD-10-CM | POA: Diagnosis not present

## 2023-08-26 DIAGNOSIS — Z79899 Other long term (current) drug therapy: Secondary | ICD-10-CM | POA: Diagnosis not present

## 2023-09-02 DIAGNOSIS — Z30430 Encounter for insertion of intrauterine contraceptive device: Secondary | ICD-10-CM | POA: Diagnosis not present

## 2023-09-02 DIAGNOSIS — N920 Excessive and frequent menstruation with regular cycle: Secondary | ICD-10-CM | POA: Diagnosis not present

## 2023-09-04 DIAGNOSIS — Z3200 Encounter for pregnancy test, result unknown: Secondary | ICD-10-CM | POA: Diagnosis not present

## 2023-09-04 DIAGNOSIS — M62830 Muscle spasm of back: Secondary | ICD-10-CM | POA: Diagnosis not present

## 2023-09-04 DIAGNOSIS — M5134 Other intervertebral disc degeneration, thoracic region: Secondary | ICD-10-CM | POA: Diagnosis not present

## 2023-09-04 DIAGNOSIS — Z79899 Other long term (current) drug therapy: Secondary | ICD-10-CM | POA: Diagnosis not present

## 2023-09-04 DIAGNOSIS — M51369 Other intervertebral disc degeneration, lumbar region without mention of lumbar back pain or lower extremity pain: Secondary | ICD-10-CM | POA: Diagnosis not present

## 2023-09-15 DIAGNOSIS — N898 Other specified noninflammatory disorders of vagina: Secondary | ICD-10-CM | POA: Diagnosis not present

## 2023-09-18 DIAGNOSIS — Z79899 Other long term (current) drug therapy: Secondary | ICD-10-CM | POA: Diagnosis not present

## 2023-09-18 DIAGNOSIS — Z3200 Encounter for pregnancy test, result unknown: Secondary | ICD-10-CM | POA: Diagnosis not present

## 2023-09-18 DIAGNOSIS — Z6841 Body Mass Index (BMI) 40.0 and over, adult: Secondary | ICD-10-CM | POA: Diagnosis not present

## 2023-09-18 DIAGNOSIS — M51369 Other intervertebral disc degeneration, lumbar region without mention of lumbar back pain or lower extremity pain: Secondary | ICD-10-CM | POA: Diagnosis not present

## 2023-09-18 DIAGNOSIS — M62830 Muscle spasm of back: Secondary | ICD-10-CM | POA: Diagnosis not present

## 2023-09-18 DIAGNOSIS — M5134 Other intervertebral disc degeneration, thoracic region: Secondary | ICD-10-CM | POA: Diagnosis not present

## 2023-10-16 DIAGNOSIS — Z6841 Body Mass Index (BMI) 40.0 and over, adult: Secondary | ICD-10-CM | POA: Diagnosis not present

## 2023-10-16 DIAGNOSIS — Z3200 Encounter for pregnancy test, result unknown: Secondary | ICD-10-CM | POA: Diagnosis not present

## 2023-10-19 DIAGNOSIS — M5442 Lumbago with sciatica, left side: Secondary | ICD-10-CM | POA: Diagnosis not present

## 2023-10-19 DIAGNOSIS — M545 Low back pain, unspecified: Secondary | ICD-10-CM | POA: Diagnosis not present

## 2023-10-24 DIAGNOSIS — M51369 Other intervertebral disc degeneration, lumbar region without mention of lumbar back pain or lower extremity pain: Secondary | ICD-10-CM | POA: Diagnosis not present

## 2023-10-24 DIAGNOSIS — M5134 Other intervertebral disc degeneration, thoracic region: Secondary | ICD-10-CM | POA: Diagnosis not present

## 2023-10-24 DIAGNOSIS — Z79899 Other long term (current) drug therapy: Secondary | ICD-10-CM | POA: Diagnosis not present

## 2023-10-24 DIAGNOSIS — Z6841 Body Mass Index (BMI) 40.0 and over, adult: Secondary | ICD-10-CM | POA: Diagnosis not present

## 2023-10-24 DIAGNOSIS — Z3200 Encounter for pregnancy test, result unknown: Secondary | ICD-10-CM | POA: Diagnosis not present

## 2023-10-24 DIAGNOSIS — M62830 Muscle spasm of back: Secondary | ICD-10-CM | POA: Diagnosis not present

## 2023-11-24 DIAGNOSIS — Z79899 Other long term (current) drug therapy: Secondary | ICD-10-CM | POA: Diagnosis not present

## 2023-12-08 DIAGNOSIS — Z6841 Body Mass Index (BMI) 40.0 and over, adult: Secondary | ICD-10-CM | POA: Diagnosis not present

## 2023-12-08 DIAGNOSIS — Z3200 Encounter for pregnancy test, result unknown: Secondary | ICD-10-CM | POA: Diagnosis not present

## 2023-12-08 DIAGNOSIS — M546 Pain in thoracic spine: Secondary | ICD-10-CM | POA: Diagnosis not present

## 2024-01-06 DIAGNOSIS — Z79899 Other long term (current) drug therapy: Secondary | ICD-10-CM | POA: Diagnosis not present

## 2024-01-06 DIAGNOSIS — Z3200 Encounter for pregnancy test, result unknown: Secondary | ICD-10-CM | POA: Diagnosis not present

## 2024-01-06 DIAGNOSIS — Z6841 Body Mass Index (BMI) 40.0 and over, adult: Secondary | ICD-10-CM | POA: Diagnosis not present

## 2024-01-06 DIAGNOSIS — M546 Pain in thoracic spine: Secondary | ICD-10-CM | POA: Diagnosis not present

## 2024-01-08 DIAGNOSIS — Z79899 Other long term (current) drug therapy: Secondary | ICD-10-CM | POA: Diagnosis not present

## 2024-02-14 DIAGNOSIS — Z79899 Other long term (current) drug therapy: Secondary | ICD-10-CM | POA: Diagnosis not present

## 2024-02-16 DIAGNOSIS — Z79899 Other long term (current) drug therapy: Secondary | ICD-10-CM | POA: Diagnosis not present

## 2024-03-15 DIAGNOSIS — R9431 Abnormal electrocardiogram [ECG] [EKG]: Secondary | ICD-10-CM | POA: Diagnosis not present

## 2024-03-19 DIAGNOSIS — Z79899 Other long term (current) drug therapy: Secondary | ICD-10-CM | POA: Diagnosis not present

## 2024-03-22 DIAGNOSIS — R9431 Abnormal electrocardiogram [ECG] [EKG]: Secondary | ICD-10-CM | POA: Diagnosis not present

## 2024-04-25 DIAGNOSIS — Z131 Encounter for screening for diabetes mellitus: Secondary | ICD-10-CM | POA: Diagnosis not present

## 2024-04-25 DIAGNOSIS — E7800 Pure hypercholesterolemia, unspecified: Secondary | ICD-10-CM | POA: Diagnosis not present

## 2024-04-25 DIAGNOSIS — Z79899 Other long term (current) drug therapy: Secondary | ICD-10-CM | POA: Diagnosis not present

## 2024-04-25 DIAGNOSIS — E559 Vitamin D deficiency, unspecified: Secondary | ICD-10-CM | POA: Diagnosis not present

## 2024-04-25 DIAGNOSIS — R5383 Other fatigue: Secondary | ICD-10-CM | POA: Diagnosis not present

## 2024-04-28 DIAGNOSIS — Z79899 Other long term (current) drug therapy: Secondary | ICD-10-CM | POA: Diagnosis not present

## 2024-05-29 DIAGNOSIS — Z6841 Body Mass Index (BMI) 40.0 and over, adult: Secondary | ICD-10-CM | POA: Diagnosis not present

## 2024-05-29 DIAGNOSIS — M51361 Other intervertebral disc degeneration, lumbar region with lower extremity pain only: Secondary | ICD-10-CM | POA: Diagnosis not present

## 2024-05-29 DIAGNOSIS — Z79899 Other long term (current) drug therapy: Secondary | ICD-10-CM | POA: Diagnosis not present

## 2024-05-29 DIAGNOSIS — E559 Vitamin D deficiency, unspecified: Secondary | ICD-10-CM | POA: Diagnosis not present

## 2024-05-29 DIAGNOSIS — Z3200 Encounter for pregnancy test, result unknown: Secondary | ICD-10-CM | POA: Diagnosis not present

## 2024-05-29 DIAGNOSIS — I10 Essential (primary) hypertension: Secondary | ICD-10-CM | POA: Diagnosis not present

## 2024-06-30 DIAGNOSIS — Z79899 Other long term (current) drug therapy: Secondary | ICD-10-CM | POA: Diagnosis not present
